# Patient Record
Sex: Male | Born: 1975 | Race: Black or African American | Hispanic: No | Marital: Single | Smoking: Current some day smoker
Health system: Southern US, Community
[De-identification: ages and names within clinical notes are randomized; demographics above are authoritative.]

## PROBLEM LIST (undated history)

## (undated) DIAGNOSIS — I82409 Acute embolism and thrombosis of unspecified deep veins of unspecified lower extremity: Secondary | ICD-10-CM

---

## 2008-12-13 ENCOUNTER — Emergency Department (HOSPITAL_BASED_OUTPATIENT_CLINIC_OR_DEPARTMENT_OTHER): Admission: EM | Admit: 2008-12-13 | Discharge: 2008-12-13 | Payer: Self-pay | Admitting: Emergency Medicine

## 2012-07-16 ENCOUNTER — Emergency Department (HOSPITAL_COMMUNITY): Payer: Self-pay

## 2012-07-16 ENCOUNTER — Encounter (HOSPITAL_COMMUNITY): Payer: Self-pay | Admitting: Emergency Medicine

## 2012-07-16 ENCOUNTER — Emergency Department (HOSPITAL_COMMUNITY)
Admission: EM | Admit: 2012-07-16 | Discharge: 2012-07-16 | Disposition: A | Discharge status: Home / Self Care | Payer: Self-pay | Attending: Emergency Medicine | Admitting: Emergency Medicine

## 2012-07-16 DIAGNOSIS — Y9289 Other specified places as the place of occurrence of the external cause: Secondary | ICD-10-CM | POA: Insufficient documentation

## 2012-07-16 DIAGNOSIS — F17200 Nicotine dependence, unspecified, uncomplicated: Secondary | ICD-10-CM | POA: Insufficient documentation

## 2012-07-16 DIAGNOSIS — W208XXA Other cause of strike by thrown, projected or falling object, initial encounter: Secondary | ICD-10-CM | POA: Insufficient documentation

## 2012-07-16 DIAGNOSIS — Y9389 Activity, other specified: Secondary | ICD-10-CM | POA: Insufficient documentation

## 2012-07-16 DIAGNOSIS — S62101A Fracture of unspecified carpal bone, right wrist, initial encounter for closed fracture: Secondary | ICD-10-CM

## 2012-07-16 DIAGNOSIS — S62109A Fracture of unspecified carpal bone, unspecified wrist, initial encounter for closed fracture: Secondary | ICD-10-CM | POA: Insufficient documentation

## 2012-07-16 MED ORDER — OXYCODONE-ACETAMINOPHEN 5-325 MG PO TABS: ORAL_TABLET | ORAL | Status: DC

## 2012-07-16 MED ORDER — OXYCODONE-ACETAMINOPHEN 5-325 MG PO TABS
2.0000 | ORAL_TABLET | Freq: Once | ORAL | Status: AC
  Administered 2012-07-16: 2 via ORAL
  Filled 2012-07-16: qty 2
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
## 2012-07-16 NOTE — ED Provider Notes (Signed)
Medical screening examination/treatment/procedure(s) were performed by non-physician practitioner and as supervising physician I was immediately available for consultation/collaboration.  Flint Melter, MD 07/16/12 2159

## 2013-06-29 ENCOUNTER — Encounter (HOSPITAL_BASED_OUTPATIENT_CLINIC_OR_DEPARTMENT_OTHER): Payer: Self-pay | Admitting: Emergency Medicine

## 2013-06-29 ENCOUNTER — Emergency Department (HOSPITAL_BASED_OUTPATIENT_CLINIC_OR_DEPARTMENT_OTHER)
Admission: EM | Admit: 2013-06-29 | Discharge: 2013-06-29 | Disposition: A | Discharge status: Home / Self Care | Payer: Self-pay | Attending: Emergency Medicine | Admitting: Emergency Medicine

## 2013-06-29 ENCOUNTER — Emergency Department (HOSPITAL_BASED_OUTPATIENT_CLINIC_OR_DEPARTMENT_OTHER): Payer: Self-pay

## 2013-06-29 DIAGNOSIS — Y92838 Other recreation area as the place of occurrence of the external cause: Secondary | ICD-10-CM

## 2013-06-29 DIAGNOSIS — S62319A Displaced fracture of base of unspecified metacarpal bone, initial encounter for closed fracture: Secondary | ICD-10-CM | POA: Insufficient documentation

## 2013-06-29 DIAGNOSIS — W219XXA Striking against or struck by unspecified sports equipment, initial encounter: Secondary | ICD-10-CM | POA: Insufficient documentation

## 2013-06-29 DIAGNOSIS — S62308A Unspecified fracture of other metacarpal bone, initial encounter for closed fracture: Secondary | ICD-10-CM

## 2013-06-29 DIAGNOSIS — Y9239 Other specified sports and athletic area as the place of occurrence of the external cause: Secondary | ICD-10-CM | POA: Insufficient documentation

## 2013-06-29 DIAGNOSIS — F17200 Nicotine dependence, unspecified, uncomplicated: Secondary | ICD-10-CM | POA: Insufficient documentation

## 2013-06-29 DIAGNOSIS — Y9371 Activity, boxing: Secondary | ICD-10-CM | POA: Insufficient documentation

## 2013-06-29 MED ORDER — NAPROXEN 250 MG PO TABS
500.0000 mg | ORAL_TABLET | Freq: Once | ORAL | Status: AC
  Administered 2013-06-29: 500 mg via ORAL
  Filled 2013-06-29: qty 2

## 2013-06-29 MED ORDER — OXYCODONE-ACETAMINOPHEN 5-325 MG PO TABS: 1.0000 | ORAL_TABLET | Freq: Four times a day (QID) | ORAL | Status: DC | PRN
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
## 2013-06-29 NOTE — ED Notes (Signed)
Pt voiced understanding to follow up with orthopedic surgeon

## 2013-06-29 NOTE — Discharge Instructions (Signed)
Cast or Splint Care  Casts and splints support injured limbs and keep bones from moving while they heal. It is important to care for your cast or splint at home.   HOME CARE INSTRUCTIONS   Keep the cast or splint uncovered during the drying period. It can take 24 to 48 hours to dry if it is made of plaster. A fiberglass cast will dry in less than 1 hour.   Do not rest the cast on anything harder than a pillow for the first 24 hours.   Do not put weight on your injured limb or apply pressure to the cast until your health care provider gives you permission.   Keep the cast or splint dry. Wet casts or splints can lose their shape and may not support the limb as well. A wet cast that has lost its shape can also create harmful pressure on your skin when it dries. Also, wet skin can become infected.   Cover the cast or splint with a plastic bag when bathing or when out in the rain or snow. If the cast is on the trunk of the body, take sponge baths until the cast is removed.   If your cast does become wet, dry it with a towel or a blow dryer on the cool setting only.   Keep your cast or splint clean. Soiled casts may be wiped with a moistened cloth.   Do not place any hard or soft foreign objects under your cast or splint, such as cotton, toilet paper, lotion, or powder.   Do not try to scratch the skin under the cast with any object. The object could get stuck inside the cast. Also, scratching could lead to an infection. If itching is a problem, use a blow dryer on a cool setting to relieve discomfort.   Do not trim or cut your cast or remove padding from inside of it.   Exercise all joints next to the injury that are not immobilized by the cast or splint. For example, if you have a long leg cast, exercise the hip joint and toes. If you have an arm cast or splint, exercise the shoulder, elbow, thumb, and fingers.   Elevate your injured arm or leg on 1 or 2 pillows for the first 1 to 3 days to decrease  swelling and pain.It is best if you can comfortably elevate your cast so it is higher than your heart.  SEEK MEDICAL CARE IF:    Your cast or splint cracks.   Your cast or splint is too tight or too loose.   You have unbearable itching inside the cast.   Your cast becomes wet or develops a soft spot or area.   You have a bad smell coming from inside your cast.   You get an object stuck under your cast.   Your skin around the cast becomes red or raw.   You have new pain or worsening pain after the cast has been applied.  SEEK IMMEDIATE MEDICAL CARE IF:    You have fluid leaking through the cast.   You are unable to move your fingers or toes.   You have discolored (blue or white), cool, painful, or very swollen fingers or toes beyond the cast.   You have tingling or numbness around the injured area.   You have severe pain or pressure under the cast.   You have any difficulty with your breathing or have shortness of breath.   You have chest   pain.  Document Released: 03/22/2000 Document Revised: 01/13/2013 Document Reviewed: 10/01/2012  [employer] Patient Information 2014 [employer].
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
## 2014-08-25 ENCOUNTER — Emergency Department (HOSPITAL_COMMUNITY)
Admission: EM | Admit: 2014-08-25 | Discharge: 2014-08-25 | Disposition: A | Discharge status: Home / Self Care | Payer: Self-pay | Attending: Emergency Medicine | Admitting: Emergency Medicine

## 2014-08-25 ENCOUNTER — Emergency Department (HOSPITAL_COMMUNITY): Payer: Self-pay

## 2014-08-25 ENCOUNTER — Encounter (HOSPITAL_COMMUNITY): Payer: Self-pay

## 2014-08-25 DIAGNOSIS — R6883 Chills (without fever): Secondary | ICD-10-CM | POA: Insufficient documentation

## 2014-08-25 DIAGNOSIS — R519 Headache, unspecified: Secondary | ICD-10-CM

## 2014-08-25 DIAGNOSIS — M791 Myalgia, unspecified site: Secondary | ICD-10-CM

## 2014-08-25 DIAGNOSIS — R5383 Other fatigue: Secondary | ICD-10-CM | POA: Insufficient documentation

## 2014-08-25 DIAGNOSIS — Z720 Tobacco use: Secondary | ICD-10-CM | POA: Insufficient documentation

## 2014-08-25 DIAGNOSIS — R51 Headache: Secondary | ICD-10-CM | POA: Insufficient documentation

## 2014-08-25 LAB — COMPREHENSIVE METABOLIC PANEL
ALK PHOS: 45 U/L (ref 38–126)
ALT: 14 U/L — AB (ref 17–63)
ANION GAP: 11 (ref 5–15)
AST: 19 U/L (ref 15–41)
Albumin: 3.6 g/dL (ref 3.5–5.0)
BILIRUBIN TOTAL: 0.7 mg/dL (ref 0.3–1.2)
BUN: 10 mg/dL (ref 6–20)
CO2: 22 mmol/L (ref 22–32)
Calcium: 8.1 mg/dL — ABNORMAL LOW (ref 8.9–10.3)
Chloride: 102 mmol/L (ref 101–111)
Creatinine, Ser: 1.18 mg/dL (ref 0.61–1.24)
GLUCOSE: 94 mg/dL (ref 65–99)
POTASSIUM: 3.6 mmol/L (ref 3.5–5.1)
SODIUM: 135 mmol/L (ref 135–145)
TOTAL PROTEIN: 7 g/dL (ref 6.5–8.1)

## 2014-08-25 LAB — CBC WITH DIFFERENTIAL/PLATELET
BASOS ABS: 0 10*3/uL (ref 0.0–0.1)
Basophils Relative: 1 % (ref 0–1)
Eosinophils Absolute: 0 10*3/uL (ref 0.0–0.7)
Eosinophils Relative: 1 % (ref 0–5)
HEMATOCRIT: 44.5 % (ref 39.0–52.0)
Hemoglobin: 14.6 g/dL (ref 13.0–17.0)
Lymphocytes Relative: 48 % — ABNORMAL HIGH (ref 12–46)
Lymphs Abs: 1 10*3/uL (ref 0.7–4.0)
MCH: 27.6 pg (ref 26.0–34.0)
MCHC: 32.8 g/dL (ref 30.0–36.0)
MCV: 84.1 fL (ref 78.0–100.0)
MONOS PCT: 14 % — AB (ref 3–12)
Monocytes Absolute: 0.3 10*3/uL (ref 0.1–1.0)
Neutro Abs: 0.7 10*3/uL — ABNORMAL LOW (ref 1.7–7.7)
Neutrophils Relative %: 36 % — ABNORMAL LOW (ref 43–77)
PLATELETS: 98 10*3/uL — AB (ref 150–400)
RBC: 5.29 MIL/uL (ref 4.22–5.81)
RDW: 14.3 % (ref 11.5–15.5)
WBC: 2 10*3/uL — AB (ref 4.0–10.5)

## 2014-08-25 LAB — PATHOLOGIST SMEAR REVIEW

## 2014-08-25 LAB — RAPID STREP SCREEN (MED CTR MEBANE ONLY): STREPTOCOCCUS, GROUP A SCREEN (DIRECT): NEGATIVE

## 2014-08-25 MED ORDER — SODIUM CHLORIDE 0.9 % IV BOLUS (SEPSIS)
1000.0000 mL | INTRAVENOUS | Status: AC
  Administered 2014-08-25: 1000 mL via INTRAVENOUS

## 2014-08-25 MED ORDER — ACETAMINOPHEN 500 MG PO TABS
1000.0000 mg | ORAL_TABLET | Freq: Once | ORAL | Status: AC
  Administered 2014-08-25: 1000 mg via ORAL
  Filled 2014-08-25: qty 2

## 2014-08-25 MED ORDER — IBUPROFEN 800 MG PO TABS: 800.0000 mg | ORAL_TABLET | Freq: Three times a day (TID) | ORAL | Status: DC | PRN

## 2014-08-25 MED ORDER — DIPHENHYDRAMINE HCL 50 MG/ML IJ SOLN
25.0000 mg | Freq: Once | INTRAMUSCULAR | Status: AC
  Administered 2014-08-25: 25 mg via INTRAVENOUS
  Filled 2014-08-25: qty 1

## 2014-08-25 MED ORDER — KETOROLAC TROMETHAMINE 30 MG/ML IJ SOLN
30.0000 mg | Freq: Once | INTRAMUSCULAR | Status: AC
  Administered 2014-08-25: 30 mg via INTRAVENOUS
  Filled 2014-08-25: qty 1

## 2014-08-25 MED ORDER — PROCHLORPERAZINE EDISYLATE 5 MG/ML IJ SOLN
10.0000 mg | Freq: Once | INTRAMUSCULAR | Status: AC
  Administered 2014-08-25: 10 mg via INTRAVENOUS
  Filled 2014-08-25: qty 2

## 2014-08-25 NOTE — ED Notes (Signed)
Patient transported to X-ray 

## 2014-08-25 NOTE — ED Notes (Signed)
Pt complains of being weak and cold for three days, no vomiting or diarrhea, no cough

## 2014-08-25 NOTE — ED Provider Notes (Signed)
CSN: [id number]     Arrival date & time 08/25/14  0433 History   First MD Initiated Contact with Patient 08/25/14 0444     Chief Complaint  Patient presents with  . URI   (Consider location/radiation/quality/duration/timing/severity/associated sxs/prior Treatment) HPI  Luis Carson is a 39 -year-old male presenting with headache. He states his headache began gradually 2 days ago and has been intermittent since then. He reports the headache is across his forehead. He also reports feeling weak and tired with some general muscle aches. He rates his pain as a 7/10. He denies photophobia, nausea, vomiting, abdominal pain, neck pain or stiffness, nasal congestion or cough.  History reviewed. No pertinent past medical history. History reviewed. No pertinent past surgical history. History reviewed. No pertinent family history. History  Substance Use Topics  . Smoking status: Current Some Day Smoker  . Smokeless tobacco: Not on file  . Alcohol Use: No    Review of Systems  Constitutional: Positive for chills and fatigue. Negative for fever.  HENT: Negative for sore throat.   Eyes: Negative for visual disturbance.  Respiratory: Negative for cough and shortness of breath.   Cardiovascular: Negative for chest pain and leg swelling.  Gastrointestinal: Negative for nausea, vomiting and diarrhea.  Genitourinary: Negative for dysuria.  Musculoskeletal: Positive for myalgias.  Skin: Negative for rash.  Neurological: Positive for headaches. Negative for weakness and numbness.    Allergies  Review of patient's allergies indicates no known allergies.  Home Medications   Prior to Admission medications   Medication Sig Start Date End Date Taking? Authorizing Provider  DM-Doxylamine-Acetaminophen (NYQUIL COLD & FLU PO) Take 1 capsule by mouth once.   Yes Historical Provider, MD  Phenylephrine-DM-GG-APAP 5-10-200-325 MG TABS Take 1 capsule by mouth once.   Yes Historical Provider, MD   oxyCODONE-acetaminophen (PERCOCET) 5-325 MG per tablet Take 1-2 tablets every 4-6 hours as needed for pain Patient not taking: Reported on 08/25/2014 07/16/12   Lowell BoutonBarbara A Beck, PA-C  oxyCODONE-acetaminophen (PERCOCET) 5-325 MG per tablet Take 1 tablet by mouth every 6 (six) hours as needed. Patient not taking: Reported on 08/25/2014 06/29/13   April Palumbo, MD   BP 116/71 mmHg  Pulse 91  Temp(Src) 99.3 F (37.4 C) (Oral)  Resp 18  Ht 6\' 2"  (1.88 m)  Wt 250 lb (113.399 kg)  BMI 32.08 kg/m2  SpO2 97% Physical Exam  Constitutional: He is oriented to person, place, and time. He appears well-developed and well-nourished. No distress.  HENT:  Head: Normocephalic and atraumatic.  Mouth/Throat: Oropharynx is clear and moist. No oropharyngeal exudate.  Eyes: Conjunctivae are normal. Pupils are equal, round, and reactive to light.  Neck: Normal range of motion. Neck supple.  Cardiovascular: Normal rate, regular rhythm and intact distal pulses.   Pulmonary/Chest: Effort normal and breath sounds normal. No respiratory distress. He has no wheezes. He has no rales. He exhibits no tenderness.  Abdominal: Soft. He exhibits no distension and no mass. There is no tenderness. There is no rebound and no guarding.  Musculoskeletal: Normal range of motion. He exhibits no tenderness.  Lymphadenopathy:    He has no cervical adenopathy.  Neurological: He is alert and oriented to person, place, and time. He has normal strength. No cranial nerve deficit or sensory deficit. GCS eye subscore is 4. GCS verbal subscore is 5. GCS motor subscore is 6.  Neurological: She is alert and oriented to person, place, and time. She has normal strength. No cranial nerve deficit or sensory deficit. Coordination  normal.  Alert, oriented, thought content appropriate. Speech fluent without evidence of aphasia. Able to follow 2 step commands without difficulty.  Cranial Nerves:  II:  Peripheral visual fields grossly normal, pupils  equal, round, reactive to light III,IV, VI: ptosis not present, extra-ocular motions intact bilaterally  V,VII: smile symmetric, facial light touch sensation equal VIII: hearing grossly normal bilaterally  IX,X: gag reflex present  XI: bilateral shoulder shrug equal and strong XII: midline tongue extension  Motor:  5/5 in upper and lower extremities bilaterally including strong and equal grip strength and dorsiflexion/plantar flexion Sensory: Pinprick and light touch normal in all extremities.  Deep Tendon Reflexes: 2+ and symmetric  Cerebellar: normal finger-to-nose with bilateral upper extremities Gait: normal gait and balance CV: distal pulses palpable throughout     Skin: Skin is warm and dry. No rash noted. He is not diaphoretic.  Psychiatric: He has a normal mood and affect.  Nursing note and vitals reviewed.   ED Course  Procedures (including critical care time) Labs Review Labs Reviewed - No data to display  Imaging Review No results found.   EKG Interpretation None      MDM   Final diagnoses:  None   39 yo presenting with muscle aches, generalized fatigue and gradual onset headache. No clear viral or bacterial source as he denies nasal congestion, cough, sore throat, neck stiffness, dysuria, or wounds. He is overall well appearing and in no acute distress.  Will treat with NS bolus and headache meds and swab for strep.   6:05 AM: At end of shift, hand-off report given to [facility], PA-C.  Plan includes re-eval after headache cocktail and follow strep swab and disposition appropriately.   Filed Vitals:   08/25/14 0440  BP: 116/71  Pulse: 91  Temp: 99.3 F (37.4 C)  TempSrc: Oral  Resp: 18  Height: 6\' 2"  (1.88 m)  Weight: 250 lb (113.399 kg)  SpO2: 97%   Meds given in ED:  Medications  sodium chloride 0.9 % bolus 1,000 mL (0 mLs Intravenous Stopped 08/25/14 0710)  ketorolac (TORADOL) 30 MG/ML injection 30 mg (30 mg Intravenous Given 08/25/14 0556)   prochlorperazine (COMPAZINE) injection 10 mg (10 mg Intravenous Given 08/25/14 0555)  diphenhydrAMINE (BENADRYL) injection 25 mg (25 mg Intravenous Given 08/25/14 0556)  sodium chloride 0.9 % bolus 1,000 mL (0 mLs Intravenous Stopped 08/25/14 0807)  acetaminophen (TYLENOL) tablet 1,000 mg (1,000 mg Oral Given 08/25/14 0709)    New Prescriptions   No medications on file       Harle BattiestElizabeth Emily Massar, NP 08/25/14 1636  Loren Raceravid Yelverton, MD 08/26/14 [phone]

## 2014-08-25 NOTE — ED Provider Notes (Signed)
6:24 AM Patient signed out to me at change of shift by Donetta PottsBeth Tysinger, NP.  Patient with intermittent, gradually worsening headache x 2 days.  Suspected viral syndrome.  Strep screen, HA cocktail pending.    6:33 AM Pt reports he is not feeling any different after headache cocktail.  Has had myalgias, headache, feeling hot/cold.  No other specific symptoms.  He does have a new tattoo but it is from a tattoo artist/shop he trusts, done 1 month ago, no e/o infection.  Denies URI symptoms, GI symptoms, skin infections, neck stiffness, recent travel, possibility of tick exposure (including sitting outside, long grass), dental pain, joint pain, rashes, penile discharge.  Has had negative STD/HIV testing in the past. No known sick contacts.  Patient is A&Ox4, NAD, skin warm, oropharynx clear no abnormal dentition noted, RRR, lungs CTAB, abd soft, NT.  Added CBC,CMP, blood cultures, CXR.  Declines HIV testing today.    8:53 AM Pt reports he is feeling better.  Workup remarkable for leukopenia with lymphocyte predominance.  Likely early viral illness.  Encouraged close PCP follow up and return for any worsening symptoms.  Suspect this is viral and source may declare itself soon.   I discussed all results with the patient including the unknown source and discussed strict return precautions.  Will have case manager see patient if available prior to discharge for help with primary care follow up.  Resources provided.  D/C with ibuprofen.   Discussed result, findings, treatment, and follow up  with patient.  Pt given return precautions.  Pt verbalizes understanding and agrees with plan.      Results for orders placed or performed during the hospital encounter of 08/25/14  Rapid strep screen  Result Value Ref Range   Streptococcus, Group A Screen (Direct) NEGATIVE NEGATIVE  Comprehensive metabolic panel  Result Value Ref Range   Sodium 135 135 - 145 mmol/L   Potassium 3.6 3.5 - 5.1 mmol/L   Chloride 102 101 - 111  mmol/L   CO2 22 22 - 32 mmol/L   Glucose, Bld 94 65 - 99 mg/dL   BUN 10 6 - 20 mg/dL   Creatinine, Ser 3.081.18 0.61 - 1.24 mg/dL   Calcium 8.1 (L) 8.9 - 10.3 mg/dL   Total Protein 7.0 6.5 - 8.1 g/dL   Albumin 3.6 3.5 - 5.0 g/dL   AST 19 15 - 41 U/L   ALT 14 (L) 17 - 63 U/L   Alkaline Phosphatase 45 38 - 126 U/L   Total Bilirubin 0.7 0.3 - 1.2 mg/dL   GFR calc non Af Amer >60 >60 mL/min   GFR calc Af Amer >60 >60 mL/min   Anion gap 11 5 - 15  CBC with Differential  Result Value Ref Range   WBC 2.0 (L) 4.0 - 10.5 K/uL   RBC 5.29 4.22 - 5.81 MIL/uL   Hemoglobin 14.6 13.0 - 17.0 g/dL   HCT 65.744.5 84.639.0 - 96.252.0 %   MCV 84.1 78.0 - 100.0 fL   MCH 27.6 26.0 - 34.0 pg   MCHC 32.8 30.0 - 36.0 g/dL   RDW 95.214.3 84.111.5 - 32.415.5 %   Platelets 98 (L) 150 - 400 K/uL   Neutrophils Relative % 36 (L) 43 - 77 %   Lymphocytes Relative 48 (H) 12 - 46 %   Monocytes Relative 14 (H) 3 - 12 %   Eosinophils Relative 1 0 - 5 %   Basophils Relative 1 0 - 1 %   Neutro Abs  0.7 (L) 1.7 - 7.7 K/uL   Lymphs Abs 1.0 0.7 - 4.0 K/uL   Monocytes Absolute 0.3 0.1 - 1.0 K/uL   Eosinophils Absolute 0.0 0.0 - 0.7 K/uL   Basophils Absolute 0.0 0.0 - 0.1 K/uL   Smear Review MORPHOLOGY UNREMARKABLE    Dg Chest 2 View  08/25/2014   CLINICAL DATA:  39 year old male with a history of generalized body aches.  EXAM: CHEST - 2 VIEW  COMPARISON:  None.  FINDINGS: Cardiomediastinal silhouette projects within normal limits in size and contour. No confluent airspace disease, pneumothorax, or pleural effusion.  No displaced fracture.  Unremarkable appearance of the upper abdomen.  IMPRESSION: No radiographic evidence of acute cardiopulmonary disease.  Signed,  Yvone NeuJaime S. Loreta AveWagner, DO  [facility]  [facility]   Electronically Signed   By: Gilmer MorJaime  Wagner D.O.   On: 08/25/2014 07:45      Trixie Dredgemily Crispin Vogel, PA-C 08/25/14 0855  Trixie DredgeEmily Taleia Sadowski, PA-C 08/25/14 [id number]  Loren Raceravid Yelverton, MD 08/26/14 [phone]

## 2014-08-25 NOTE — Progress Notes (Addendum)
CM noted CM consult,  CM called by ED RN,  consult reviewed referral to [facility] completed,  Irving BurtonEmily PA notified Pt wiill be contacted by [facility] staff member and can be discharged

## 2014-08-25 NOTE — Progress Notes (Signed)
ED CM left pt a voice message requesting a return call to CM office number

## 2014-08-25 NOTE — ED Notes (Signed)
Patient complains of lethargy and headache x 2 days.  Denies N/V/D.  Denies cough/sore throat/sneezing.  Has been taking Tylenol Cold & Flu with minimal relief.

## 2014-08-25 NOTE — Discharge Instructions (Signed)
Read the information below.  Use the prescribed medication as directed.  Please discuss all new medications with your pharmacist.  You may return to the Emergency Department at any time for worsening condition or any new symptoms that concern you.    If you develop high fevers, uncontrolled pain, uncontrolled vomiting, or difficulty breathing or swallowing return to the ER for a recheck.       You are having a headache. No specific cause was found today for your headache. It may have been a migraine or other cause of headache. Stress, anxiety, fatigue, and depression are common triggers for headaches. Your headache today does not appear to be life-threatening or require hospitalization, but often the exact cause of headaches is not determined in the emergency department. Therefore, follow-up with your doctor is very important to find out what may have caused your headache, and whether or not you need any further diagnostic testing or treatment. Sometimes headaches can appear benign (not harmful), but then more serious symptoms can develop which should prompt an immediate re-evaluation by your doctor or the emergency department. SEEK MEDICAL ATTENTION IF: You develop possible problems with medications prescribed.  The medications don't resolve your headache, if it recurs , or if you have multiple episodes of vomiting or can't take fluids. You have a change from the usual headache. RETURN IMMEDIATELY IF you develop a sudden, severe headache or confusion, become poorly responsive or faint, develop a fever above 100.32F or problem breathing, have a change in speech, vision, swallowing, or understanding, or develop new weakness, numbness, tingling, incoordination, or have a seizure.    General Headache Without Cause A headache is pain or discomfort felt around the head or neck area. The specific cause of a headache may not be found. There are many causes and types of headaches. A few common ones  are:  Tension headaches.  Migraine headaches.  Cluster headaches.  Chronic daily headaches. HOME CARE INSTRUCTIONS   Keep all follow-up appointments with your caregiver or any specialist referral.  Only take over-the-counter or prescription medicines for pain or discomfort as directed by your caregiver.  Lie down in a dark, quiet room when you have a headache.  Keep a headache journal to find out what may trigger your migraine headaches. For example, write down:  What you eat and drink.  How much sleep you get.  Any change to your diet or medicines.  Try massage or other relaxation techniques.  Put ice packs or heat on the head and neck. Use these 3 to 4 times per day for 15 to 20 minutes each time, or as needed.  Limit stress.  Sit up straight, and do not tense your muscles.  Quit smoking if you smoke.  Limit alcohol use.  Decrease the amount of caffeine you drink, or stop drinking caffeine.  Eat and sleep on a regular schedule.  Get 7 to 9 hours of sleep, or as recommended by your caregiver.  Keep lights dim if bright lights bother you and make your headaches worse. SEEK MEDICAL CARE IF:   You have problems with the medicines you were prescribed.  Your medicines are not working.  You have a change from the usual headache.  You have nausea or vomiting. SEEK IMMEDIATE MEDICAL CARE IF:   Your headache becomes severe.  You have a fever.  You have a stiff neck.  You have loss of vision.  You have muscular weakness or loss of muscle control.  You start losing your  balance or have trouble walking.  You feel faint or pass out.  You have severe symptoms that are different from your first symptoms. MAKE SURE YOU:   Understand these instructions.  Will watch your condition.  Will get help right away if you are not doing well or get worse. Document Released: 03/25/2005 Document Revised: 06/17/2011 Document Reviewed: 04/10/2011 [facility] Patient  Information 2015 [place], [place]. This information is not intended to replace advice given to you by your health care provider. Make sure you discuss any questions you have with your health care provider.  Muscle Pain Muscle pain (myalgia) may be caused by many things, including:  Overuse or muscle strain, especially if you are not in shape. This is the most common cause of muscle pain.  Injury.  Bruises.  Viruses, such as the flu.  Infectious diseases.  Fibromyalgia, which is a chronic condition that causes muscle tenderness, fatigue, and headache.  Autoimmune diseases, including lupus.  Certain drugs, including ACE inhibitors and statins. Muscle pain may be mild or severe. In most cases, the pain lasts only a short time and goes away without treatment. To diagnose the cause of your muscle pain, your health care provider will take your medical history. This means he or she will ask you when your muscle pain began and what has been happening. If you have not had muscle pain for very long, your health care provider may want to wait before doing much testing. If your muscle pain has lasted a long time, your health care provider may want to run tests right away. If your health care provider thinks your muscle pain may be caused by illness, you may need to have additional tests to rule out certain conditions.  Treatment for muscle pain depends on the cause. Home care is often enough to relieve muscle pain. Your health care provider may also prescribe anti-inflammatory medicine. HOME CARE INSTRUCTIONS Watch your condition for any changes. The following actions may help to lessen any discomfort you are feeling:  Only take over-the-counter or prescription medicines as directed by your health care provider.  Apply ice to the sore muscle:  Put ice in a plastic bag.  Place a towel between your skin and the bag.  Leave the ice on for 15-20 minutes, 3-4 times a day.  You may alternate applying  hot and cold packs to the muscle as directed by your health care provider.  If overuse is causing your muscle pain, slow down your activities until the pain goes away.  Remember that it is normal to feel some muscle pain after starting a workout program. Muscles that have not been used often will be sore at first.  Do regular, gentle exercises if you are not usually active.  Warm up before exercising to lower your risk of muscle pain.  Do not continue working out if the pain is very bad. Bad pain could mean you have injured a muscle. SEEK MEDICAL CARE IF:  Your muscle pain gets worse, and medicines do not help.  You have muscle pain that lasts longer than 3 days.  You have a rash or fever along with muscle pain.  You have muscle pain after a tick bite.  You have muscle pain while working out, even though you are in good physical condition.  You have redness, soreness, or swelling along with muscle pain.  You have muscle pain after starting a new medicine or changing the dose of a medicine. SEEK IMMEDIATE MEDICAL CARE IF:  You  have trouble breathing.  You have trouble swallowing.  You have muscle pain along with a stiff neck, fever, and vomiting.  You have severe muscle weakness or cannot move part of your body. MAKE SURE YOU:   Understand these instructions.  Will watch your condition.  Will get help right away if you are not doing well or get worse. Document Released: 02/14/2006 Document Revised: 03/30/2013 Document Reviewed: 01/19/2013 [facility] Patient Information 2015 [place], [place]. This information is not intended to replace advice given to you by your health care provider. Make sure you discuss any questions you have with your health care provider.    Emergency Department Resource Guide 1) Find a Doctor and Pay Out of Pocket Although you won't have to find out who is covered by your insurance plan, it is a good idea to ask around and get recommendations. You  will then need to call the office and see if the doctor you have chosen will accept you as a new patient and what types of options they offer for patients who are self-pay. Some doctors offer discounts or will set up payment plans for their patients who do not have insurance, but you will need to ask so you aren't surprised when you get to your appointment.  2) Contact Your Local Health Department Not all health departments have doctors that can see patients for sick visits, but many do, so it is worth a call to see if yours does. If you don't know where your local health department is, you can check in your phone book. The CDC also has a tool to help you locate your state's health department, and many state websites also have listings of all of their local health departments.  3) Find a Walk-in Clinic If your illness is not likely to be very severe or complicated, you may want to try a walk in clinic. These are popping up all over the country in pharmacies, drugstores, and shopping centers. They're usually staffed by nurse practitioners or physician assistants that have been trained to treat common illnesses and complaints. They're usually fairly quick and inexpensive. However, if you have serious medical issues or chronic medical problems, these are probably not your best option.  No Primary Care Doctor: - Call Health Connect at  [phone] - they can help you locate a primary care doctor that  accepts your insurance, provides certain services, etc. - Physician Referral Service- [phone]  Chronic Pain Problems: Organization         Address  Phone   Notes  [facility]  [phone] Patients need to be referred by their primary care doctor.   Medication Assistance: Organization         Address  Phone   Notes  [facility] Med[facility] [address] [place], [place] [address] [phone] --Must be a resident of [facility] -- Must  have NO insurance coverage whatsoever (no Medicaid/ Medicare, etc.) -- The pt. MUST have a primary care doctor that directs their care regularly and follows them in the community   Med[facility]ist  [phone]   [employer]  [phone]    Agencies that provide inexpensive medical care: Organization         Address  Phone   Notes  [facility]  [phone]   [facility]    [phone]   [facility] [address] [place], [place] [address] [phone]  [facility] [address], [place] [phone]) [phone]   Planned Parenthood    [phone]   [facility]    [phone]   [facility] and [facility]  [address], [place] Phone:  [phone](336) [phone], Fax:  [phone] Hours of Operation:  9 am - 6 pm, M-F.  Also accepts Medicaid/Medicare and self-pay.  [facility]  [address], [place] Phone: [phone]) [phone], Fax: [phone]. Hours of Operation:  8:30 am - 5:30 pm, M-F.  Also accepts Medicaid and self-pay.  [facility] [place] [address], [place] Phone: [phone]) [phone]   [facility] [address], [place], [place] [phone], Ext. [phone] Mondays & Thursdays: 7-9 AM.  First 15 patients are seen on a first come, first serve basis.    Medicaid-accepting [facility] Providers:  Organization         Address  Phone   Notes  [facility] [address], [place] [phone] Also accepts self-pay patients.  [facility] [address], [place]  [phone]) [phone]   [facility] [address], [place] [phone]   [facility] [address] [place] [address], [place] [phone]) [phone]   Renaye RakersVeita Bland [address], [place]   [phone]) [phone] Only accepts [place] Access [place] patients after  they have their name applied to their card.   Self-Pay (no insurance) in [facility]:  Organization         Address  Phone   Notes  Sickle Cell Patients, [facility] [address], [place] [phone]) [phone]   [facility] [address], [place] [phone]) [phone]   [facility]  [address], [place] [phone]) [phone]   [facility]/Dr. Osei-Bonsu  [address] [place] [address], [place] or [phone], [place] [phone]) [phone] Phone number for both [place] and [place] locations is the same.  [facility] [address], [place] [phone]   [facility] [place] [address] [place] [address], [place] or [address] [phone]) [phone]   [facility] [address], [place] [phone](336) [phone], phone; [phone], fax Sees patients 1st and 3rd Saturday of every month.  Must not qualify for public or private insurance (i.e. Medicaid, Medicare, Center Health Choice, Veterans' Benefits)  Household income should be no more than 200% of the poverty level The clinic cannot treat you if you are pregnant or think you are pregnant  Sexually transmitted diseases are not treated at the clinic.    Dental Care: Organization         Address  Phone  Notes  [facility] [address], [place] [phone] Accepts children up to age 37 who are enrolled in [place] or [place] Health Choice; pregnant women with a Medicaid card; and children who have applied for Medicaid or [place] Health Choice, but were declined, whose parents can pay a reduced fee at time of service.  [facility] [place]  [address], [place] [phone] Accepts children up to age 521 who are enrolled in [place] or  Health Choice; pregnant women with a Medicaid card; and children  who  have applied for Medicaid or [place] Health Choice, but were declined, whose parents can pay a reduced fee at time of service.  [facility]  [address], [place] [phone]) [phone] Patients are seen by appointment only. Walk-ins are not accepted. [facility] will see patients 37 years of age and older. Monday - Tuesday (8am-5pm) Most Wednesdays (8:30-5pm) $30 per visit, cash only  [facility]  [address], [facility] [phone] Patients are seen by appointment only. Walk-ins are not accepted. [facility] will see patients 37 years of age and older. One Wednesday Evening (Monthly: Volunteer Based).  $30 per visit, cash only  [employer]  [phone]) [phone] for adults; Children under age 674, call Graduate Pediatric Dentistry at [phone]) [phone]. Children aged 554-14, please call [phone]) [phone] to request a pediatric application.  Dental services are provided in all areas of dental care including fillings, crowns and bridges, complete and partial dentures, implants, gum treatment, root canals, and extractions. Preventive care is also provided. Treatment is provided to both adults and children. Patients are selected via a lottery and there is often a waiting list.   [facility] [address], [place]  [phone] [email or web address]   [facility] [address], [place], [place] [phone], Ext. [phone] Second and Fourth Thursday of each month, opens at 6:30 AM; Clinic ends at 9 AM.  Patients are seen on a first-come first-served basis, and a limited number are seen during each clinic.   [facility]  [address], [place], [place] [phone]) [phone]   Eligibility Requirements You must have lived in [place], [place], or [place] counties for at least the last three months.   You cannot be eligible for state or federal sponsored National Cityhealthcare insurance, including [employer]eterans  Administration, [place]icaid, or [employer]Medicare.   You generally cannot be eligible for healthcare insurance through your employer.    How to apply: Eligibility screenings are held every Tuesday and Wednesday afternoon from 1:00 pm until 4:00 pm. You do not need an appointment for the interview!  [facility] [address], [place], [place] [phone]   [facility]  [phone]   [facility]  [phone]   [facility]  [phone]    Behavioral Health Resources in the Community: Intensive Outpatient Programs Organization         Address  Phone  Notes  [facility] [address], [place], [place] [phone]   [facility] [address], [place], [place] [phone]   [facility] [address], [place], [place]  [phone]   [facility] [address],  [place], [place] [phone] or [phone]   Substance Abuse Resources Organization         Address  Phone  Notes  [facility] Drug Services  [phone]   [facility]  [phone]   [facility]  [phone]   [facility]  [phone]   Residential & Outpatient Substance Abuse Program  [phone]   Psychological Services Organization         Address  Phone  Notes  [facility]  [phone]- [phone]   [facility]  [phone]- [phone]   [facility] [address], [place] [phone] or [phone]    Mobile Crisis Teams Organization  Address  Phone  Notes  [facility], Mobile Crisis Care Unit  [phone]   [facility]  [address] [place], [place] [phone]   [facility] [address] [place] [phone]    Self-Help/Support Groups Organization         Address  Phone             Notes  Mental Health Assoc. of  -  variety of support groups  336- I7437963 Call for more information  Narcotics Anonymous (NA), [facility]aring [facility] [address], [employer] [place]  2 meetings at this location   Statistician         Address  Phone  Notes  [facility] [address],    [place] [place]  [phone]   [facility]  [address] [place] [address], [place], [place] [phone]   [facility] [address] [place], [place] [phone] Admissions: 8am-3pm M-F  [facility] [address],    [place], [place] [phone]   [facility] [address], [place], [place] [phone]   [facility] [address],  [place], [place] [phone]   [facility] - Intensive Outpatient [address], [place], [place] [phone]   [facility] ([facility] [address]  [place], [place] [phone] or [phone]   [facility] (RTS) [address] [place], [place] [phone] Accepts Medicaid  [facility] [address],  [place] [phone] Substance Abuse/Addiction Treatment   [facility] Organization         Address  Phone  Notes  [facility]  [phone]   Angie Fava, PhD [address] [place], [place]   [phone] or [phone]   [facility]   [address] [place], [place] [phone]   [facility] [address], [place], [place] [phone] Insurance/Medicaid/sponsorship through [facility] and Families [address]                                    Chamaine Stankus [place], [place] [phone] Therapy/tele-psych/case  [facility] [address], [place] [phone]    Dr. Lolly Mustache  [phone]   [facility]  [employer] [facility]. 1) [address], [place] 2) [address] [place] 3)  [address], [place] [phone]  [phone]   [facility] Child Abuse Hotline 254-320-2099 or [phone] (After Hours)

## 2014-08-27 LAB — CULTURE, GROUP A STREP

## 2014-08-31 LAB — CULTURE, BLOOD (ROUTINE X 2)
CULTURE: NO GROWTH
CULTURE: NO GROWTH

## 2015-02-11 ENCOUNTER — Emergency Department (HOSPITAL_COMMUNITY)
Admission: EM | Admit: 2015-02-11 | Discharge: 2015-02-11 | Disposition: A | Discharge status: Home / Self Care | Payer: Self-pay | Attending: Emergency Medicine | Admitting: Emergency Medicine

## 2015-02-11 ENCOUNTER — Emergency Department (HOSPITAL_COMMUNITY): Payer: Self-pay

## 2015-02-11 ENCOUNTER — Encounter (HOSPITAL_COMMUNITY): Payer: Self-pay | Admitting: Emergency Medicine

## 2015-02-11 DIAGNOSIS — M79644 Pain in right finger(s): Secondary | ICD-10-CM | POA: Insufficient documentation

## 2015-02-11 DIAGNOSIS — Z720 Tobacco use: Secondary | ICD-10-CM | POA: Insufficient documentation

## 2015-02-11 DIAGNOSIS — M7989 Other specified soft tissue disorders: Secondary | ICD-10-CM | POA: Insufficient documentation

## 2015-02-11 DIAGNOSIS — Z79899 Other long term (current) drug therapy: Secondary | ICD-10-CM | POA: Insufficient documentation

## 2015-02-11 MED ORDER — IBUPROFEN 800 MG PO TABS: 800.0000 mg | ORAL_TABLET | Freq: Three times a day (TID) | ORAL | Status: DC

## 2015-02-11 MED ORDER — IBUPROFEN 800 MG PO TABS
800.0000 mg | ORAL_TABLET | Freq: Once | ORAL | Status: AC
  Administered 2015-02-11: 800 mg via ORAL
  Filled 2015-02-11: qty 1

## 2015-02-11 NOTE — Discharge Instructions (Signed)
1. Medications: ibuprofen, usual home medications 2. Treatment: rest, drink plenty of fluids, ice, elevate, wear finger splint 3. Follow Up: please followup with your primary doctor for discussion of your diagnoses and further evaluation after today's visit; if you do not have a primary care doctor use the resource guide provided to find one; please return to the ER for increased pain or swelling, numbness, new or worsening symptoms   Musculoskeletal Pain Musculoskeletal pain is muscle and boney aches and pains. These pains can occur in any part of the body. Your caregiver may treat you without knowing the cause of the pain. They may treat you if blood or urine tests, X-rays, and other tests were normal.  CAUSES There is often not a definite cause or reason for these pains. These pains may be caused by a type of germ (virus). The discomfort may also come from overuse. Overuse includes working out too hard when your body is not fit. Boney aches also come from weather changes. Bone is sensitive to atmospheric pressure changes. HOME CARE INSTRUCTIONS   Ask when your test results will be ready. Make sure you get your test results.  Only take over-the-counter or prescription medicines for pain, discomfort, or fever as directed by your caregiver. If you were given medications for your condition, do not drive, operate machinery or power tools, or sign legal documents for 24 hours. Do not drink alcohol. Do not take sleeping pills or other medications that may interfere with treatment.  Continue all activities unless the activities cause more pain. When the pain lessens, slowly resume normal activities. Gradually increase the intensity and duration of the activities or exercise.  During periods of severe pain, bed rest may be helpful. Lay or sit in any position that is comfortable.  Putting ice on the injured area.  Put ice in a bag.  Place a towel between your skin and the bag.  Leave the ice on for  15 to 20 minutes, 3 to 4 times a day.  Follow up with your caregiver for continued problems and no reason can be found for the pain. If the pain becomes worse or does not go away, it may be necessary to repeat tests or do additional testing. Your caregiver may need to look further for a possible cause. SEEK IMMEDIATE MEDICAL CARE IF:  You have pain that is getting worse and is not relieved by medications.  You develop chest pain that is associated with shortness or breath, sweating, feeling sick to your stomach (nauseous), or throw up (vomit).  Your pain becomes localized to the abdomen.  You develop any new symptoms that seem different or that concern you. MAKE SURE YOU:   Understand these instructions.  Will watch your condition.  Will get help right away if you are not doing well or get worse.   This information is not intended to replace advice given to you by your health care provider. Make sure you discuss any questions you have with your health care provider.   Document Released: 03/25/2005 Document Revised: 06/17/2011 Document Reviewed: 11/27/2012 Elsevier Interactive Patient Education 2016 [employer].   Emergency Department Resource Guide 1) Find a Doctor and Pay Out of Pocket Although you won't have to find out who is covered by your insurance plan, it is a good idea to ask around and get recommendations. You will then need to call the office and see if the doctor you have chosen will accept you as a new patient and what types  of options they offer for patients who are self-pay. Some doctors offer discounts or will set up payment plans for their patients who do not have insurance, but you will need to ask so you aren't surprised when you get to your appointment.  2) Contact Your Local Health Department Not all health departments have doctors that can see patients for sick visits, but many do, so it is worth a call to see if yours does. If you don't know where your local  health department is, you can check in your phone book. The CDC also has a tool to help you locate your state's health department, and many state websites also have listings of all of their local health departments.  3) Find a Walk-in Clinic If your illness is not likely to be very severe or complicated, you may want to try a walk in clinic. These are popping up all over the country in pharmacies, drugstores, and shopping centers. They're usually staffed by nurse practitioners or physician assistants that have been trained to treat common illnesses and complaints. They're usually fairly quick and inexpensive. However, if you have serious medical issues or chronic medical problems, these are probably not your best option.  No Primary Care Doctor: - Call Health Connect at  [phone] - they can help you locate a primary care doctor that  accepts your insurance, provides certain services, etc. - Physician Referral Service- [phone]  Chronic Pain Problems: Organization         Address  Phone   Notes  [facility]  [phone] Patients need to be referred by their primary care doctor.   Medication Assistance: Organization         Address  Phone   Notes  [facility] Medication [facility] [address] [place], [place] [phone] --Must be a resident of [facility] -- Must have NO insurance coverage whatsoever (no Medicaid/ Medicare, etc.) -- The pt. MUST have a primary care doctor that directs their care regularly and follows them in the community   MedAssist  [phone]   [employer]  [phone]    Agencies that provide inexpensive medical care: Organization         Address  Phone   Notes  [facility]  [phone]) [phone]   [facility]    [phone]) [phone]   [facility] [address] [place], [place] [address] [phone]   [facility] [address], [place] [phone]) [phone]   [facility]anned Parenthood    [phone](336) [phone]   [facility]    [phone]   [facility] and [facility]  [address], [place] Phone:  [phone], Fax:  [phone] Hours of Operation:  9 am - 6 pm, M-F.  Also accepts Medicaid/Medicare and self-pay.  [facility]  [address], [place] Phone: [phone], Fax: [phone]. Hours of Operation:  8:30 am - 5:30 pm, M-F.  Also accepts Medicaid and self-pay.  [facility] [place] [address], [place] Phone: [phone]) [phone]   [facility] [address], [place], [place] [phone], Ext. [phone] Mondays & Thursdays: 7-9 AM.  First 15 patients are seen on a first come, first serve basis.    Medicaid-accepting [facility] Providers:  Organization         Address  Phone   Notes  [facility] [address], [place] [phone] Also accepts self-pay patients.  [facility] [address] Laurell Josephs [place], [place]  [phone]   [facility] [address], [place] [phone]   [facility] [address] [place] [phone]   Renaye Rakers [address], [place]   [phone] Only accepts [place] Access [place] patients after they have their name applied to their card.   Self-Pay (no insurance) in [facility]:  Organization         Address  Phone   Notes  Sickle Cell Patients, [facility] [address] [place], [place] [phone]   [facility] [address] [place], [place] [phone]   [facility]  [address],  [place] [phone]   [facility]/Dr. Osei-Bonsu  [address] [place] or [address], [place] [phone] Phone number for both [place] and  [place] locations is the same.  [facility]gent [facility] [address] [place] [phone]   [facility] [address] [place] or [address] [phone]   [facility] [address] [place] [phone], phone; [phone], fax Sees patients 1st and 3rd Saturday of every month.  Must not qualify for public or private insurance (i.e. Medicaid, Medicare, [place] Health Choice, Veterans' Benefits)  Household income should be no more than 200% of the poverty level The clinic cannot treat you if you are pregnant or think you are pregnant  Sexually transmitted diseases are not treated at the clinic.    Dental Care: Organization         Address  Phone  Notes  [facility] [address] [place], [place] [phone] Accepts children up to age 65 who are enrolled in [place] or [place] Health Choice; pregnant women with a Medicaid card; and children who have applied for Medicaid or [place] Health Choice, but were declined, whose parents can pay a reduced fee at time of service.  [facility]  [address], [place] [phone] Accepts children up to age 58 who are enrolled in [place] or [place] Health Choice; pregnant women with a Medicaid card; and children who have applied for Medicaid or [place] Health Choice, but were declined, whose parents can pay a reduced fee at time of service.  [facility]  [address] [place], [place] [phone] Patients are seen by appointment only. Walk-ins are not accepted. [facility] will see patients 69 years of age and older. Monday - Tuesday (8am-5pm) Most Wednesdays (8:30-5pm) $30 per visit, cash only  [facility] Adult Dental Access PROGRAM  [address], [facility] [phone] Patients are seen by appointment only. Walk-ins are not accepted.  [facility] will see patients 2 years of age and older. One Wednesday Evening (Monthly: Volunteer Based).  $30 per visit, cash only  [employer]  [phone] for adults; Children under age 14, call Graduate Pediatric Dentistry at [phone]. Children aged 75-14, please call [phone] to request a pediatric application.  Dental services are provided in all areas of dental care including fillings, crowns and bridges, complete and partial  dentures, implants, gum treatment, root canals, and extractions. Preventive care is also provided. Treatment is provided to both adults and children. Patients are selected via a lottery and there is often a waiting list.   [facility] [address], [place]  [phone] [email or web address]   [facility] [address] [place], [place] [phone], Ext. [phone] Second and Fourth Thursday of each month, opens at 6:30 AM; Clinic ends at 9 AM.  Patients are seen on a first-come first-served basis, and a limited number are seen during each clinic.   [facility]  [address] Ether Griffins [place], [place] [phone]   Eligibility Requirements You must have lived in [place], [place], or [place] counties for at least the last three months.   You cannot be eligible for state or federal sponsored [place], including [employer], [place], or [employer].   You generally cannot be eligible for healthcare insurance through your employer.    How to apply: Eligibility screenings are held every Tuesday and Wednesday afternoon from 1:00 pm until 4:00 pm. You do not need an appointment for the interview!  [facility] [address] [place], [place] [phone]   [facility] Health Department  [phone]   [facility] Health Department  [phone]   [facility] Health Department  [phone]    Behavioral Health Resources in the  Community: Intensive Outpatient Programs Organization         Address  Phone  Notes  [facility] Services [address], [place], [place] [phone]   [facility] [address] [place], [place] [phone]   [facility] [address], [place], [place]  [phone]   [facility] Mental Health [address]  [place], [place] [phone] or [phone]   Substance Abuse Resources Organization         Address  Phone  Notes  Al[facility] Drug Services  [phone]   [facility]  [phone]   [facility]  [phone]   Floydene Flock  [phone]   Residential & Outpatient Substance Abuse Program  [phone]   Psychological Services Organization         Address  Phone  Notes  [facility]  [phone]   [facility] Services  [phone]   [facility] Mental Health [address], [place] [phone] or [phone]    Mobile Crisis Teams Organization         Address  Phone  Notes  [facility], Mobile Crisis Care Unit  [phone]   [facility]  [address] [place], [place] [phone]   Doristine Locks [address] [place] [phone]    Self-Help/Support Groups Organization         Address  Phone             Notes  [facility] [place] - variety of support groups  [phone]- I7437963 Call for more information  Narcotics Anonymous (NA), [facility] [address], [employer] [place]  2 meetings at this location   Statistician         Address  Phone  Notes  [facility] [address],    [place]  [phone]   [facility]  [address] [place] [address], [place], [place] [phone]   [facility] Treatment Facility [address] [place], [place] [phone] Admissions: 8am-3pm M-F  Incentives  [facility]stance Abuse Treatment Center [address]    [place], [place] [phone]   [facility] [address] [place], [place], [place] [phone]   [facility] [address]  [place], [place] [phone]   [facility] - Intensive Outp[address], [place], [place] [phone]   [facility] ([facility]iction [facility] [address] [place]  [place], [place] [phone] or [phone]   [facility] (RT[facility]) [address] [place], [place] [phone] Accepts Medicaid  [facility] [address]  [place] [phone] Substance Abuse/Addiction Treatment   [facility] Organization         Address  Phone  Notes  [facility]  [phone]   Angie Fava, PhD [address] Ervin Knack [place], [place]   [phone] or [phone]   [facility] Behavioral   [address] [place], [place] [phone]   [facility] [address], [place], [place] [phone] Insurance/Medicaid/sponsorship through [facility] and Families [address]                                    [place], [place] [phone] Therapy/tele-psych/case  [facility] [address], [place] [phone]    Dr. Lolly Mustache  [phone]   [facility]  [employer] [facility] Dept. 1) [address] [place] 2) [address] [place] 3)  [address], [place] [phone]  [phone]   [facility] Child Abuse Hotline (816)114-2633 or [phone] (After Hours)

## 2015-02-11 NOTE — ED Provider Notes (Signed)
History  By signing my name below, I, Karle PlumberJennifer Tensley, attest that this documentation has been prepared under the direction and in the presence of Glean HessElizabeth Zoeya Gramajo, [place]PA-C. Electronically Signed: Karle PlumberJennifer Tensley, ED Scribe. 02/11/2015. 3:03 PM.  Chief Complaint  Patient presents with  . Hand Pain    pain and swelling in 4th finger of r/hand   The history is provided by the patient and medical records. No language interpreter was used.     HPI Comments:  Luis Carson is a 39 y.o. male with no pertinent PMH who presents to the ED with of constant right fourth finger pain and swelling, which began approximately two days ago. He denies recent injury, though states he may have inadvertently hit his finger at work. He has tried tylenol for pain with no significant symptom relief. He reports movement exacerbates his pain. He denies fever, chills, nausea, vomiting, numbness, weakness, paresthesia.  History reviewed. No pertinent past medical history. History reviewed. No pertinent past surgical history. History reviewed. No pertinent family history. Social History  Substance Use Topics  . Smoking status: Current Some Day Smoker  . Smokeless tobacco: None  . Alcohol Use: No      Review of Systems  Constitutional: Negative for fever and chills.  Musculoskeletal: Positive for joint swelling and arthralgias.  Skin: Negative for color change.  Neurological: Negative for weakness and numbness.    Allergies  Review of patient's allergies indicates no known allergies.  Home Medications   Prior to Admission medications   Medication Sig Start Date End Date Taking? Authorizing Provider  DM-Doxylamine-Acetaminophen (NYQUIL COLD & FLU PO) Take 1 capsule by mouth once.    Historical Provider, MD  ibuprofen (ADVIL,MOTRIN) 800 MG tablet Take 1 tablet (800 mg total) by mouth 3 (three) times daily. 02/11/15   Mady GemmaElizabeth C Elease Swarm, PA-C  oxyCODONE-acetaminophen (PERCOCET) 5-325 MG per tablet  Take 1-2 tablets every 4-6 hours as needed for pain Patient not taking: Reported on 08/25/2014 07/16/12   Lowell BoutonBarbara A Beck, PA-C  oxyCODONE-acetaminophen (PERCOCET) 5-325 MG per tablet Take 1 tablet by mouth every 6 (six) hours as needed. Patient not taking: Reported on 08/25/2014 06/29/13   April Palumbo, MD  Phenylephrine-DM-GG-APAP 5-10-200-325 MG TABS Take 1 capsule by mouth once.    Historical Provider, MD    Triage Vitals: BP 117/80 mmHg  Pulse 61  Temp(Src) 98.3 F (36.8 C) (Oral)  Resp 18  SpO2 100% Physical Exam  Constitutional: He is oriented to person, place, and time. He appears well-developed and well-nourished. No distress.  HENT:  Head: Normocephalic and atraumatic.  Right Ear: External ear normal.  Left Ear: External ear normal.  Nose: Nose normal.  Mouth/Throat: Oropharynx is clear and moist.  Eyes: Conjunctivae and EOM are normal. Pupils are equal, round, and reactive to light. Right eye exhibits no discharge. Left eye exhibits no discharge. No scleral icterus.  Neck: Normal range of motion. Neck supple.  Cardiovascular: Normal rate and regular rhythm.   Pulmonary/Chest: Effort normal and breath sounds normal. No respiratory distress.  Musculoskeletal: Normal range of motion. He exhibits edema and tenderness.  TTP of right 4th DIP with mild edema and decreased range of motion due to pain. No erythema or warmth. Strength and sensation intact. Distal pulses intact. Cap refill <3 seconds.  Neurological: He is alert and oriented to person, place, and time.  Skin: Skin is warm and dry. He is not diaphoretic.  Psychiatric: He has a normal mood and affect. His behavior is normal.  Nursing  note and vitals reviewed.   ED Course  Procedures (including critical care time)  DIAGNOSTIC STUDIES: Oxygen Saturation is 100% on RA, normal by my interpretation.   COORDINATION OF CARE: 2:34 PM- Will X-Ray right hand and order Ibuprofen prior to imaging. Pt verbalizes understanding and  agrees to plan.  Medications  ibuprofen (ADVIL,MOTRIN) tablet 800 mg (800 mg Oral Given 02/11/15 1458)    Labs Review Labs Reviewed - No data to display  Imaging Review Dg Hand Complete Right  02/11/2015  CLINICAL DATA:  39 year old male with pain and stiffness in the ring finger of the right hand. No history of injury. EXAM: RIGHT HAND - COMPLETE 3+ VIEW COMPARISON:  No priors. FINDINGS: No acute displaced fracture. 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 In the fourth DIP joint there is joint space narrowing, subchondral sclerosis and osteophyte formation, suggesting osteoarthritis. There is also some dorsal soft tissue swelling at this level. Old healed fracture at the base of the fourth metacarpal with mild posttraumatic deformity, and old healed ulnar styloid fracture with posttraumatic deformity. IMPRESSION: 1. Degenerative changes of osteoarthritis, as above, most pronounced at the fourth DIP joint, where there is also some dorsal soft tissue swelling. 2. Posttraumatic changes, as above. Electronically Signed   By: Trudie Reed M.D.   On: 02/11/2015 14:57   I have personally reviewed and evaluated these images as part of my medical decision-making.   EKG Interpretation None      MDM   Final diagnoses:  Pain of finger of right hand    39 year old male presents with right 4th finger pain and swelling, which started 2 days ago. Denies numbness, weakness, paresthesia. Denies recent injury, though states he may have inadvertently hit his finger at work. Patient is afebrile. Vital signs stable. TTP of right 4th DIP with mild edema and decreased range of motion due to pain. No erythema or warmth. Strength and sensation intact. Distal pulses intact. Cap refill <3 seconds. Imaging remarkable for degenerative changes of osteoarthritis, dorsal soft tissue swelling. Will treat with finger splint, ibuprofen, and RICE therapy. Patient to  follow-up with PCP. Return precautions discussed. Patient verbalizes his understanding and is in agreement with plan.  BP 117/80 mmHg  Pulse 67  Temp(Src) 98.3 F (36.8 C) (Oral)  Resp 18  SpO2 100%  I personally performed the services described in this documentation, which was scribed in my presence. The recorded information has been reviewed and is accurate.       Mady Gemma, PA-C 02/11/15 2329  Benjiman Core, MD 02/12/15 [phone]

## 2015-02-11 NOTE — ED Notes (Signed)
2 day hx of increased swelling and tendeness in 4th finger of r/hand. Swelling is from nail bed to 1st joint

## 2015-02-22 ENCOUNTER — Emergency Department (HOSPITAL_COMMUNITY)
Admission: EM | Admit: 2015-02-22 | Discharge: 2015-02-22 | Disposition: A | Discharge status: Home / Self Care | Payer: Self-pay | Attending: Emergency Medicine | Admitting: Emergency Medicine

## 2015-02-22 ENCOUNTER — Encounter (HOSPITAL_COMMUNITY): Payer: Self-pay | Admitting: Nurse Practitioner

## 2015-02-22 DIAGNOSIS — L03011 Cellulitis of right finger: Secondary | ICD-10-CM | POA: Insufficient documentation

## 2015-02-22 DIAGNOSIS — Z8781 Personal history of (healed) traumatic fracture: Secondary | ICD-10-CM | POA: Insufficient documentation

## 2015-02-22 DIAGNOSIS — F17200 Nicotine dependence, unspecified, uncomplicated: Secondary | ICD-10-CM | POA: Insufficient documentation

## 2015-02-22 DIAGNOSIS — Z791 Long term (current) use of non-steroidal anti-inflammatories (NSAID): Secondary | ICD-10-CM | POA: Insufficient documentation

## 2015-02-22 DIAGNOSIS — L03818 Cellulitis of other sites: Secondary | ICD-10-CM

## 2015-02-22 MED ORDER — CEPHALEXIN 500 MG PO CAPS: 500.0000 mg | ORAL_CAPSULE | Freq: Four times a day (QID) | ORAL | Status: DC

## 2015-02-22 NOTE — Discharge Instructions (Signed)

## 2015-02-22 NOTE — ED Notes (Signed)
Pt presents for reevaluation of her right ring finger which he reports the swelling has worsened since he was evaluated a week ago. C/o pain 8/10.

## 2015-02-22 NOTE — ED Provider Notes (Signed)
CSN: [id number]     Arrival date & time 02/22/15  1628 History  By signing my name below, I, Luis Carson, attest that this documentation has been prepared under the direction and in the presence of Cheri Fowler, PA-C. Electronically Signed: Placido Carson, ED Scribe. 02/22/2015. 5:03 PM.   Chief Complaint  Patient presents with  . Finger Swelling     Right Ring Finger   The history is provided by the patient. No language interpreter was used.    HPI Comments: Luis Carson is a 39 y.o. male, who was seen on 11/05 for right 4th finger pain, presents to the Emergency Department complaining of worsening, mild, right 4th finger pain and swelling with onset 2 weeks ago. Pt notes that he was clipping his fingernails and accidentally over-cut the nail of the affected finger before the onset of his current symptoms. Pt notes some associated, mild, irritation and a point of discoloration to the skin of the swollen region. He notes taking ibuprofen as needed for pain management which provides short term relief. He notes a hx of fracture to the affected finger, so it "is always a little crooked and not straightened out". Pt denies numbness, tingling, fevers and chills.   History reviewed. No pertinent past medical history. History reviewed. No pertinent past surgical history. History reviewed. No pertinent family history. Social History  Substance Use Topics  . Smoking status: Current Some Day Smoker  . Smokeless tobacco: None  . Alcohol Use: No    Review of Systems A complete 10 system review of systems was obtained and all systems are negative except as noted in the HPI and PMH.  Allergies  Review of patient's allergies indicates no known allergies.  Home Medications   Prior to Admission medications   Medication Sig Start Date End Date Taking? Authorizing Provider  cephALEXin (KEFLEX) 500 MG capsule Take 1 capsule (500 mg total) by mouth 4 (four) times daily. 02/22/15   Cheri Fowler, PA-C   DM-Doxylamine-Acetaminophen (NYQUIL COLD & FLU PO) Take 1 capsule by mouth once.    Historical Provider, MD  ibuprofen (ADVIL,MOTRIN) 800 MG tablet Take 1 tablet (800 mg total) by mouth 3 (three) times daily. 02/11/15   Mady Gemma, PA-C  oxyCODONE-acetaminophen (PERCOCET) 5-325 MG per tablet Take 1-2 tablets every 4-6 hours as needed for pain Patient not taking: Reported on 08/25/2014 07/16/12   Lowell Bouton, PA-C  oxyCODONE-acetaminophen (PERCOCET) 5-325 MG per tablet Take 1 tablet by mouth every 6 (six) hours as needed. Patient not taking: Reported on 08/25/2014 06/29/13   April Palumbo, MD  Phenylephrine-DM-GG-APAP 5-10-200-325 MG TABS Take 1 capsule by mouth once.    Historical Provider, MD   BP 137/80 mmHg  Pulse 72  Temp(Src) 98.2 F (36.8 C) (Oral)  Resp 20  SpO2 99% Physical Exam  Constitutional: He is oriented to person, place, and time. He appears well-developed and well-nourished.  HENT:  Head: Normocephalic and atraumatic.  Mouth/Throat: Oropharynx is clear and moist. No oropharyngeal exudate.  Eyes: Conjunctivae are normal.  Neck: Normal range of motion. Neck supple. No tracheal deviation present.  Cardiovascular: Normal rate, regular rhythm, normal heart sounds and intact distal pulses.   Pulses:      Radial pulses are 2+ on the right side, and 2+ on the left side.  Capillary refill less than 3 seconds  Pulmonary/Chest: Effort normal and breath sounds normal. No respiratory distress. He has no wheezes. He has no rales.  Abdominal: Soft. Bowel sounds are normal.  He exhibits no distension. There is no tenderness.  Musculoskeletal: Normal range of motion.       Hands: Neurological: He is alert and oriented to person, place, and time.  Strength and sensation intact bilaterally throughout upper and lower extremities.   Skin: Skin is warm and dry. He is not diaphoretic.  Psychiatric: He has a normal mood and affect. His behavior is normal.  Nursing note and vitals  reviewed.  ED Course  Procedures  DIAGNOSTIC STUDIES: Oxygen Saturation is 99% on RA, normal by my interpretation.    COORDINATION OF CARE: 5:00 PM Pt presents today due to worsening right fourth finger pain and swelling. Discussed next steps with pt at bedside including 1x ibuprofen and reevaluation by attending physician before determination of treatment plan. Pt agreed to plan.  Labs Review Labs Reviewed - No data to display  Imaging Review No results found.   EKG Interpretation None      MDM   Final diagnoses:  Cellulitis of other specified site    Patient presents with persistent right ring finger swelling and mild pain.  Taking ibuprofen as prescribed, but now noticed some white discoloration.  No drainage.  VSS, NAD.  On exam, mild swelling and TTP of DIP joint.  Area of discoloration in between DIP and nail bed, suspect underlying pus and possible cellulitis.  Doubt abscess formation  No nail fold, bed, or lateral nail fold involvement.  No involvement of the distal finger pad.  Doubt paronychia.  Doubt felon.   Neurovascularly intact.  Capillary refill less than 3 seconds.   I personally performed the services described in this documentation, which was scribed in my presence. The recorded information has been reviewed and is accurate.    Cheri FowlerKayla Takiera Mayo, PA-C 02/22/15 1800  Benjiman CoreNathan Pickering, MD 02/23/15 [phone]

## 2017-09-13 ENCOUNTER — Emergency Department (HOSPITAL_COMMUNITY)
Admission: EM | Admit: 2017-09-13 | Discharge: 2017-09-13 | Disposition: A | Discharge status: Home / Self Care | Payer: Self-pay | Attending: Emergency Medicine | Admitting: Emergency Medicine

## 2017-09-13 ENCOUNTER — Other Ambulatory Visit: Payer: Self-pay

## 2017-09-13 ENCOUNTER — Emergency Department (HOSPITAL_COMMUNITY): Payer: Self-pay

## 2017-09-13 DIAGNOSIS — E875 Hyperkalemia: Secondary | ICD-10-CM

## 2017-09-13 DIAGNOSIS — Z86718 Personal history of other venous thrombosis and embolism: Secondary | ICD-10-CM | POA: Insufficient documentation

## 2017-09-13 DIAGNOSIS — R42 Dizziness and giddiness: Secondary | ICD-10-CM | POA: Insufficient documentation

## 2017-09-13 DIAGNOSIS — R55 Syncope and collapse: Secondary | ICD-10-CM

## 2017-09-13 DIAGNOSIS — F17200 Nicotine dependence, unspecified, uncomplicated: Secondary | ICD-10-CM | POA: Insufficient documentation

## 2017-09-13 LAB — CBC
HCT: 48.5 % (ref 39.0–52.0)
Hemoglobin: 15.8 g/dL (ref 13.0–17.0)
MCH: 27.7 pg (ref 26.0–34.0)
MCHC: 32.6 g/dL (ref 30.0–36.0)
MCV: 85.1 fL (ref 78.0–100.0)
PLATELETS: 233 10*3/uL (ref 150–400)
RBC: 5.7 MIL/uL (ref 4.22–5.81)
RDW: 14.7 % (ref 11.5–15.5)
WBC: 6.4 10*3/uL (ref 4.0–10.5)

## 2017-09-13 LAB — I-STAT CHEM 8, ED
BUN: 24 mg/dL — ABNORMAL HIGH (ref 6–20)
CALCIUM ION: 1.11 mmol/L — AB (ref 1.15–1.40)
CREATININE: 1.2 mg/dL (ref 0.61–1.24)
Chloride: 104 mmol/L (ref 101–111)
Glucose, Bld: 103 mg/dL — ABNORMAL HIGH (ref 65–99)
HCT: 46 % (ref 39.0–52.0)
HEMOGLOBIN: 15.6 g/dL (ref 13.0–17.0)
Potassium: 5.6 mmol/L — ABNORMAL HIGH (ref 3.5–5.1)
Sodium: 137 mmol/L (ref 135–145)
TCO2: 26 mmol/L (ref 22–32)

## 2017-09-13 LAB — BASIC METABOLIC PANEL
Anion gap: 6 (ref 5–15)
BUN: 16 mg/dL (ref 6–20)
CO2: 25 mmol/L (ref 22–32)
CREATININE: 1.24 mg/dL (ref 0.61–1.24)
Calcium: 8.5 mg/dL — ABNORMAL LOW (ref 8.9–10.3)
Chloride: 105 mmol/L (ref 101–111)
GFR calc Af Amer: >60 mL/min (ref >60)
GLUCOSE: 103 mg/dL — AB (ref 65–99)
Potassium: 5.8 mmol/L — ABNORMAL HIGH (ref 3.5–5.1)
SODIUM: 136 mmol/L (ref 135–145)

## 2017-09-13 LAB — I-STAT TROPONIN, ED: TROPONIN I, POC: 0 ng/mL (ref 0.00–0.08)

## 2017-09-13 LAB — CBG MONITORING, ED: GLUCOSE-CAPILLARY: 100 mg/dL — AB (ref 65–99)

## 2017-09-13 MED ORDER — SODIUM POLYSTYRENE SULFONATE 15 GM/60ML PO SUSP
30.0000 g | Freq: Once | ORAL | Status: AC
  Administered 2017-09-13: 30 g via ORAL
  Filled 2017-09-13: qty 120

## 2017-09-13 MED ORDER — SODIUM CHLORIDE 0.9 % IV BOLUS
1000.0000 mL | Freq: Once | INTRAVENOUS | Status: AC
  Administered 2017-09-13: 1000 mL via INTRAVENOUS

## 2017-09-13 MED ORDER — IOPAMIDOL (ISOVUE-370) INJECTION 76%
100.0000 mL | Freq: Once | INTRAVENOUS | Status: AC | PRN
  Administered 2017-09-13: 100 mL via INTRAVENOUS

## 2017-09-13 NOTE — Discharge Instructions (Addendum)
Your potassium was elevated today. This is likely from dehydration - drink at least 8 glasses of water daily. Avoid foods high in potassium. Follow-up in 1 week for repeat levels.

## 2017-09-13 NOTE — ED Triage Notes (Signed)
Pt from home via [facility]. C/o of near syncopal episode when he got up to walk. Sts was sitting, got hot and diaphoretic, but when he stood up to walk, felt faint and fell. Denies LOC, denies pain @ this time. Hx of DVT on Xarelto. A&O x4 on arrival.

## 2017-09-13 NOTE — ED Provider Notes (Signed)
[facility] Provider Note   CSN: [id number] Arrival date & time: 09/13/17  0159     History   Chief Complaint Chief Complaint  Patient presents with  . Near Syncope    HPI Vincent Grosunorial [place] is a 42 y.o. male.  HPI  42 yo M with PMHx DVT on Xarelto here with near syncopal episode.  Patient states that he was in his usual state of health throughout the day yesterday.  He was sitting on the hardwood floor on his buttocks feeding his dogs.  He states he was sitting for approximately 1 hour.  Then began to feel tingling in his legs due to how he was sitting.  He stood up and felt lightheaded.  He tried to move to the other room and began to feel very flushed and lightheaded.  He had to sit down.  He then stood back up and the symptoms return.  He did not actually lose consciousness.  Denies any associated chest pain, shortness of breath, or palpitations.  He has a history of previous similar symptoms and is passed out multiple times.  He does endorse not drinking enough water.  No fevers or chills.  No recent medication changes.  Is been taking his Xarelto as prescribed.  Of note, however, he did run out of it approximately a month ago, and went several weeks without it.  Denies any increase in his leg swelling.    No past medical history on file.  There are no active problems to display for this patient.   No past surgical history on file.      Home Medications    Prior to Admission medications   Medication Sig Start Date End Date Taking? Authorizing Provider  cephALEXin (KEFLEX) 500 MG capsule Take 1 capsule (500 mg total) by mouth 4 (four) times daily. 02/22/15   Cheri Fowlerose, Kayla, PA-C  DM-Doxylamine-Acetaminophen (NYQUIL COLD & FLU PO) Take 1 capsule by mouth once.    [provider]  ibuprofen (ADVIL,MOTRIN) 800 MG tablet Take 1 tablet (800 mg total) by mouth 3 (three) times daily. 02/11/15   Mady GemmaWestfall, Elizabeth C, PA-C    oxyCODONE-acetaminophen (PERCOCET) 5-325 MG per tablet Take 1-2 tablets every 4-6 hours as needed for pain Patient not taking: Reported on 08/25/2014 07/16/12   Lowell BoutonBeck, Barbara A, PA-C  oxyCODONE-acetaminophen (PERCOCET) 5-325 MG per tablet Take 1 tablet by mouth every 6 (six) hours as needed. Patient not taking: Reported on 08/25/2014 06/29/13   Palumbo, April, MD  Phenylephrine-DM-GG-APAP 5-10-200-325 MG TABS Take 1 capsule by mouth once.    [provider]    Family History No family history on file.  Social History Social History   Tobacco Use  . Smoking status: Current Some Day Smoker  Substance Use Topics  . Alcohol use: No  . Drug use: No     Allergies   Patient has no known allergies.   Review of Systems Review of Systems  Constitutional: Negative for chills, fatigue and fever.  HENT: Negative for congestion and rhinorrhea.   Eyes: Negative for visual disturbance.  Respiratory: Negative for cough, shortness of breath and wheezing.   Cardiovascular: Negative for chest pain and leg swelling.  Gastrointestinal: Negative for abdominal pain, diarrhea, nausea and vomiting.  Genitourinary: Negative for dysuria and flank pain.  Musculoskeletal: Negative for neck pain and neck stiffness.  Skin: Negative for rash and wound.  Allergic/Immunologic: Negative for immunocompromised state.  Neurological: Positive for syncope and light-headedness. Negative for weakness  and headaches.  All other systems reviewed and are negative.    Physical Exam Updated Vital Signs BP 110/69   Pulse 72   Temp 98.5 F (36.9 C) (Oral)   Resp 14   Ht 6\' 3"  (1.905 m)   Wt 108.9 kg (240 lb)   SpO2 98%   BMI 30.00 kg/m   Physical Exam  Constitutional: He is oriented to person, place, and time. He appears well-developed and well-nourished. No distress.  HENT:  Head: Normocephalic and atraumatic.  Eyes: Conjunctivae are normal.  Neck: Neck supple.  Cardiovascular: Normal rate, regular  rhythm and normal heart sounds. Exam reveals no friction rub.  No murmur heard. Pulmonary/Chest: Effort normal and breath sounds normal. No respiratory distress. He has no wheezes. He has no rales.  Abdominal: Soft. Bowel sounds are normal. He exhibits no distension.  Musculoskeletal: He exhibits no edema (No edema or leg tenderness).  Neurological: He is alert and oriented to person, place, and time. He exhibits normal muscle tone.  Skin: Skin is warm. Capillary refill takes less than 2 seconds.  Psychiatric: He has a normal mood and affect.  Nursing note and vitals reviewed.    ED Treatments / Results  Labs (all labs ordered are listed, but only abnormal results are displayed) Labs Reviewed  BASIC METABOLIC PANEL - Abnormal; Notable for the following components:      Result Value   Potassium 5.8 (*)    Glucose, Bld 103 (*)    Calcium 8.5 (*)    All other components within normal limits  CBG MONITORING, ED - Abnormal; Notable for the following components:   Glucose-Capillary 100 (*)    All other components within normal limits  I-STAT CHEM 8, ED - Abnormal; Notable for the following components:   Potassium 5.6 (*)    BUN 24 (*)    Glucose, Bld 103 (*)    Calcium, Ion 1.11 (*)    All other components within normal limits  CBC  URINALYSIS, ROUTINE W REFLEX MICROSCOPIC  I-STAT TROPONIN, ED    EKG EKG Interpretation  Date/Time:  Saturday September 13 2017 02:13:25 EDT Ventricular Rate:  68 PR Interval:    QRS Duration: 80 QT Interval:  376 QTC Calculation: 400 R Axis:   36 Text Interpretation:  Sinus rhythm Prolonged PR interval Probable left atrial enlargement Consider anterolateral infarct Baseline wander in lead(s) V1 No old tracing to compare Confirmed by Shaune Pollack [phone]) on 09/13/2017 5:15:38 AM   Radiology Ct Angio Chest Pe W Or Wo Contrast  Result Date: 09/13/2017 CLINICAL DATA:  Acute onset of syncope. Concern for pulmonary embolus. EXAM: CT ANGIOGRAPHY CHEST  WITH CONTRAST TECHNIQUE: Multidetector CT imaging of the chest was performed using the standard protocol during bolus administration of intravenous contrast. Multiplanar CT image reconstructions and MIPs were obtained to evaluate the vascular anatomy. CONTRAST:  ISOVUE-370 IOPAMIDOL (ISOVUE-370) INJECTION 76% COMPARISON:  Chest radiograph performed 08/25/2014 FINDINGS: Cardiovascular:  There is no evidence of pulmonary embolus. The heart is borderline normal in size. The great vessels are grossly unremarkable. The thoracic aorta is grossly unremarkable. Mediastinum/Nodes: The mediastinum is unremarkable. No mediastinal lymphadenopathy is seen. No pericardial effusion is identified. The visualized portions of the thyroid gland are within normal limits. No axillary lymphadenopathy is seen. Lungs/Pleura: Minimal bilateral dependent subsegmental atelectasis is noted. No pleural effusion or pneumothorax is seen. No masses are identified. Upper Abdomen: The visualized portions of the liver and spleen are grossly unremarkable. Musculoskeletal: No acute osseous abnormalities are  identified. The visualized musculature is unremarkable in appearance. Review of the MIP images confirms the above findings. IMPRESSION: 1. No evidence of pulmonary embolus. 2. Minimal bilateral dependent subsegmental atelectasis noted; lungs otherwise clear. Electronically Signed   By: Roanna Raider M.D.   On: 09/13/2017 05:17    Procedures Procedures (including critical care time)  Medications Ordered in ED Medications  sodium chloride 0.9 % bolus 1,000 mL (0 mLs Intravenous Stopped 09/13/17 0328)  iopamidol (ISOVUE-370) 76 % injection 100 mL (100 mLs Intravenous Contrast Given 09/13/17 0439)  sodium polystyrene (KAYEXALATE) 15 GM/60ML suspension 30 g (30 g Oral Given 09/13/17 1610)     Initial Impression / Assessment and Plan / ED Course  I have reviewed the triage vital signs and the nursing notes.  Pertinent labs & imaging  results that were available during my care of the patient were reviewed by me and considered in my medical decision making (see chart for details).    42 year old male with past medical history as above here with near syncopal episode.  I suspect this was vasovagal versus orthostatic in the setting of sitting down for an extended amount of time in a hot room.  He had an appropriate prodrome.  His symptoms are now resolved.  However, given his history of DVT off of his Xarelto, PE study obtained and is negative.  His lab work is otherwise unremarkable.  His BMP did show an incidental potassium of 5.8.  I suspect this is likely due to hemolysis, and he has no EKG changes.  No high risk medication use.  He is not on an ace inhibitor or ARB.  Nonetheless, given that it is over 5.5, will give him a dose of Kayexalate, and he was given fluids.  Will have him avoid high potassium foods and have this repeated in a week with good return precautions.  Final Clinical Impressions(s) / ED Diagnoses   Final diagnoses:  Near syncope  Hyperkalemia    ED Discharge Orders    None       Shaune Pollack, MD 09/13/17 [phone]

## 2017-11-05 ENCOUNTER — Emergency Department (HOSPITAL_COMMUNITY): Payer: Self-pay

## 2017-11-05 ENCOUNTER — Emergency Department (HOSPITAL_COMMUNITY)
Admission: EM | Admit: 2017-11-05 | Discharge: 2017-11-05 | Disposition: A | Discharge status: Home / Self Care | Payer: Self-pay | Attending: Emergency Medicine | Admitting: Emergency Medicine

## 2017-11-05 ENCOUNTER — Encounter (HOSPITAL_COMMUNITY): Payer: Self-pay | Admitting: Emergency Medicine

## 2017-11-05 DIAGNOSIS — K5792 Diverticulitis of intestine, part unspecified, without perforation or abscess without bleeding: Secondary | ICD-10-CM

## 2017-11-05 DIAGNOSIS — Z7901 Long term (current) use of anticoagulants: Secondary | ICD-10-CM | POA: Insufficient documentation

## 2017-11-05 DIAGNOSIS — K5732 Diverticulitis of large intestine without perforation or abscess without bleeding: Secondary | ICD-10-CM | POA: Insufficient documentation

## 2017-11-05 DIAGNOSIS — F17200 Nicotine dependence, unspecified, uncomplicated: Secondary | ICD-10-CM | POA: Insufficient documentation

## 2017-11-05 LAB — URINALYSIS, ROUTINE W REFLEX MICROSCOPIC
BACTERIA UA: NONE SEEN
BILIRUBIN URINE: NEGATIVE
GLUCOSE, UA: NEGATIVE mg/dL
KETONES UR: NEGATIVE mg/dL
Leukocytes, UA: NEGATIVE
Nitrite: NEGATIVE
PH: 5 (ref 5.0–8.0)
Protein, ur: NEGATIVE mg/dL
Specific Gravity, Urine: 1.019 (ref 1.005–1.030)

## 2017-11-05 LAB — CBC
HEMATOCRIT: 47.7 % (ref 39.0–52.0)
Hemoglobin: 16 g/dL (ref 13.0–17.0)
MCH: 28.1 pg (ref 26.0–34.0)
MCHC: 33.5 g/dL (ref 30.0–36.0)
MCV: 83.7 fL (ref 78.0–100.0)
Platelets: 188 10*3/uL (ref 150–400)
RBC: 5.7 MIL/uL (ref 4.22–5.81)
RDW: 14.1 % (ref 11.5–15.5)
WBC: 9.2 10*3/uL (ref 4.0–10.5)

## 2017-11-05 LAB — COMPREHENSIVE METABOLIC PANEL
ALT: 14 U/L (ref 0–44)
AST: 20 U/L (ref 15–41)
Albumin: 4 g/dL (ref 3.5–5.0)
Alkaline Phosphatase: 50 U/L (ref 38–126)
Anion gap: 10 (ref 5–15)
BILIRUBIN TOTAL: 0.7 mg/dL (ref 0.3–1.2)
BUN: 13 mg/dL (ref 6–20)
CO2: 25 mmol/L (ref 22–32)
Calcium: 8.9 mg/dL (ref 8.9–10.3)
Chloride: 102 mmol/L (ref 98–111)
Creatinine, Ser: 1.3 mg/dL — ABNORMAL HIGH (ref 0.61–1.24)
GFR calc Af Amer: >60 mL/min (ref >60)
Glucose, Bld: 96 mg/dL (ref 70–99)
Potassium: 3.8 mmol/L (ref 3.5–5.1)
Sodium: 137 mmol/L (ref 135–145)
Total Protein: 7.7 g/dL (ref 6.5–8.1)

## 2017-11-05 LAB — LIPASE, BLOOD: Lipase: 18 U/L (ref 11–51)

## 2017-11-05 MED ORDER — CIPROFLOXACIN HCL 500 MG PO TABS: 500.0000 mg | ORAL_TABLET | Freq: Two times a day (BID) | ORAL | 0 refills | Status: AC

## 2017-11-05 MED ORDER — MORPHINE SULFATE (PF) 2 MG/ML IV SOLN
2.0000 mg | Freq: Once | INTRAVENOUS | Status: AC
  Administered 2017-11-05: 2 mg via INTRAVENOUS
  Filled 2017-11-05: qty 1

## 2017-11-05 MED ORDER — IOPAMIDOL (ISOVUE-300) INJECTION 61%
INTRAVENOUS | Status: AC
  Filled 2017-11-05: qty 100

## 2017-11-05 MED ORDER — SODIUM CHLORIDE 0.9 % IV BOLUS
1000.0000 mL | Freq: Once | INTRAVENOUS | Status: AC
  Administered 2017-11-05: 1000 mL via INTRAVENOUS

## 2017-11-05 MED ORDER — METRONIDAZOLE 500 MG PO TABS: 500.0000 mg | ORAL_TABLET | Freq: Three times a day (TID) | ORAL | 0 refills | Status: AC

## 2017-11-05 MED ORDER — ONDANSETRON HCL 4 MG/2ML IJ SOLN
4.0000 mg | Freq: Once | INTRAMUSCULAR | Status: AC
  Administered 2017-11-05: 4 mg via INTRAVENOUS
  Filled 2017-11-05: qty 2

## 2017-11-05 MED ORDER — IOPAMIDOL (ISOVUE-300) INJECTION 61%
100.0000 mL | Freq: Once | INTRAVENOUS | Status: AC | PRN
  Administered 2017-11-05: 100 mL via INTRAVENOUS

## 2017-11-05 MED ORDER — METRONIDAZOLE 500 MG PO TABS
500.0000 mg | ORAL_TABLET | Freq: Once | ORAL | Status: AC
  Administered 2017-11-05: 500 mg via ORAL
  Filled 2017-11-05: qty 1

## 2017-11-05 MED ORDER — CIPROFLOXACIN HCL 500 MG PO TABS
500.0000 mg | ORAL_TABLET | Freq: Once | ORAL | Status: AC
  Administered 2017-11-05: 500 mg via ORAL
  Filled 2017-11-05: qty 1

## 2017-11-05 NOTE — Discharge Instructions (Addendum)
Your CT scan showed signs of diverticulitis. It is treated with 2 antibiotics, Cipro and Flagyl. You have received one dose of these medications today. Starting tomorrow, you will take both medications for 7 days each.  I have provided information on diverticulitis which includes information on what you can eat while you are being treated. I recommended a liquid diet for the first 2 days and then ease your way back into normal foods.

## 2017-11-05 NOTE — ED Provider Notes (Signed)
[facility]-EMERGENCY DEPT Provider Note  CSN: [id number] Arrival date & time: 11/05/17  1715  History   Chief Complaint Chief Complaint  Patient presents with  . Abdominal Pain    HPI Luis Carson is a 42 y.o. male with no significant medical history who presented to the ED for abdominal pain x1 day. Describes cramping RLQ pain that began this morning and has continued all day. Denies fever, chills, N/V, changes in bowel or urinary habits, radiating pain, hematuria, hematochezia/melena. Patient has not experienced this before. Patient has tried nothing prior to coming to the ED. Denies past surgical history.   History reviewed. No pertinent past medical history.  There are no active problems to display for this patient.   History reviewed. No pertinent surgical history.      Home Medications    Prior to Admission medications   Medication Sig Start Date End Date Taking? Authorizing Provider  cephALEXin (KEFLEX) 500 MG capsule Take 1 capsule (500 mg total) by mouth 4 (four) times daily. Patient not taking: Reported on 11/05/2017 02/22/15   Cheri Fowlerose, Kayla, PA-C  ciprofloxacin (CIPRO) 500 MG tablet Take 1 tablet (500 mg total) by mouth 2 (two) times daily for 7 days. 11/05/17 11/12/17  Archibald Marchetta, Jerrel IvoryGabrielle I, PA-C  ibuprofen (ADVIL,MOTRIN) 800 MG tablet Take 1 tablet (800 mg total) by mouth 3 (three) times daily. Patient not taking: Reported on 11/05/2017 02/11/15   Mady GemmaWestfall, Elizabeth C, PA-C  metroNIDAZOLE (FLAGYL) 500 MG tablet Take 1 tablet (500 mg total) by mouth 3 (three) times daily for 7 days. 11/05/17 11/12/17  Keyari Kleeman, Jerrel IvoryGabrielle I, PA-C  oxyCODONE-acetaminophen (PERCOCET) 5-325 MG per tablet Take 1-2 tablets every 4-6 hours as needed for pain Patient not taking: Reported on 08/25/2014 07/16/12   Lowell BoutonBeck, Barbara A, PA-C  oxyCODONE-acetaminophen (PERCOCET) 5-325 MG per tablet Take 1 tablet by mouth every 6 (six) hours as needed. Patient not taking: Reported on 08/25/2014  06/29/13   Palumbo, April, MD  XARELTO 15 MG TABS tablet Take 15 mg by mouth 2 (two) times daily with a meal. 08/11/17   [provider]    Family History No family history on file.  Social History Social History   Tobacco Use  . Smoking status: Current Some Day Smoker  . Smokeless tobacco: Never Used  Substance Use Topics  . Alcohol use: No  . Drug use: No     Allergies   Patient has no known allergies.   Review of Systems Review of Systems  Constitutional: Negative for chills, diaphoresis and fever.  Respiratory: Negative.   Cardiovascular: Negative.   Gastrointestinal: Positive for abdominal pain. Negative for blood in stool, constipation, diarrhea, nausea and vomiting.  Genitourinary: Negative for decreased urine volume, difficulty urinating, dysuria, hematuria, testicular pain and urgency.  Musculoskeletal: Negative.   Skin: Negative.   Neurological: Negative.      Physical Exam Updated Vital Signs BP 117/78 (BP Location: Left Arm)   Pulse 90   Temp 98.6 F (37 C) (Oral)   Resp 18   Ht 6\' 3"  (1.905 m)   Wt 113.4 kg (250 lb)   SpO2 97%   BMI 31.25 kg/m   Physical Exam  Constitutional:  Lying in bed uncomfortably. Grimacing in pain.  Cardiovascular: Normal rate, regular rhythm, normal heart sounds and intact distal pulses.  Pulmonary/Chest: Effort normal and breath sounds normal.  Abdominal: Soft. Normal appearance and bowel sounds are normal. There is tenderness in the right lower quadrant. There is guarding. There is no  rebound, no CVA tenderness, no tenderness at McBurney's point and negative Murphy's sign.  Skin: Skin is warm. He is not diaphoretic. No pallor.  Nursing note and vitals reviewed.    ED Treatments / Results  Labs (all labs ordered are listed, but only abnormal results are displayed) Labs Reviewed  COMPREHENSIVE METABOLIC PANEL - Abnormal; Notable for the following components:      Result Value   Creatinine, Ser 1.30 (*)     All other components within normal limits  URINALYSIS, ROUTINE W REFLEX MICROSCOPIC - Abnormal; Notable for the following components:   Hgb urine dipstick SMALL (*)    All other components within normal limits  LIPASE, BLOOD  CBC    EKG None  Radiology Ct Abdomen Pelvis W Contrast  Result Date: 11/05/2017 CLINICAL DATA:  Right-sided abdominal pain EXAM: CT ABDOMEN AND PELVIS WITH CONTRAST TECHNIQUE: Multidetector CT imaging of the abdomen and pelvis was performed using the standard protocol following bolus administration of intravenous contrast. CONTRAST:  ISOVUE-300 IOPAMIDOL (ISOVUE-300) INJECTION 61% COMPARISON:  Chest CT 09/13/2016 FINDINGS: Lower chest: Lung bases demonstrate no acute consolidation or effusion. The heart size is normal Hepatobiliary: No focal liver abnormality is seen. No gallstones, gallbladder wall thickening, or biliary dilatation. Pancreas: Unremarkable. No pancreatic ductal dilatation or surrounding inflammatory changes. Spleen: Normal in size without focal abnormality. Adrenals/Urinary Tract: Adrenal glands are normal. Subcentimeter hypodense renal lesions too small to further characterize. Bladder normal Stomach/Bowel: Stomach is within normal limits. No dilated small bowel. Negative appendix. Focal wall thickening and adjacent inflammatory change involving the ascending colon. Diverticula are present in the region. No extraluminal gas or abscess. Vascular/Lymphatic: No significant vascular findings are present. No enlarged abdominal or pelvic lymph nodes. Reproductive: Prostate is unremarkable. Other: Negative for free air or free fluid Musculoskeletal: No acute or significant osseous findings. IMPRESSION: 1. Focal wall thickening at the ascending colon with moderate inflammatory changes and presence of diverticula; findings are favored to represent acute right-sided diverticulitis. Consideration also given to appendagitis, given appearance of the pericolonic fat on  coronal views however diverticulitis is favored. No perforation. No abscess. Electronically Signed   By: Jasmine Pang M.D.   On: 11/05/2017 20:41    Procedures Procedures (including critical care time)  Medications Ordered in ED Medications  ondansetron (ZOFRAN) injection 4 mg (4 mg Intravenous Given 11/05/17 1847)  morphine 2 MG/ML injection 2 mg (2 mg Intravenous Given 11/05/17 1847)  iopamidol (ISOVUE-300) 61 % injection 100 mL (100 mLs Intravenous Contrast Given 11/05/17 2013)  sodium chloride 0.9 % bolus 1,000 mL (1,000 mLs Intravenous New Bag/Given 11/05/17 2121)  morphine 2 MG/ML injection 2 mg (2 mg Intravenous Given 11/05/17 2123)  ondansetron (ZOFRAN) injection 4 mg (4 mg Intravenous Given 11/05/17 2123)  ciprofloxacin (CIPRO) tablet 500 mg (500 mg Oral Given 11/05/17 2155)  metroNIDAZOLE (FLAGYL) tablet 500 mg (500 mg Oral Given 11/05/17 2155)     Initial Impression / Assessment and Plan / ED Course  Triage vital signs and the nursing notes have been reviewed.  Pertinent labs & imaging results that were available during care of the patient were reviewed and considered in medical decision making (see chart for details).  Patient presents afebrile and with normal vital signs. His physical exam is significant for exquisite RLQ tenderness even during auscultation with stethoscope. Initial labs are unremarkable and are not clearly indicative of an acutely infectious process. History and physical not consistent with gastroenteritis or nephrolithiasis. IV fluids administered in context of elevated creatinine.  Will order abdominal imaging to further evaluate cause of pain. Highest suspicions for appendicitis, diverticulitis or strangulated hernia.  Clinical Course as of Nov 06 2223  Wed Nov 05, 2017  2105 CT abdomen findings consistent with diverticulitis. Will treat as such. No evidence of perforation or free fluid in the abdomen that warrants additional evaluation. Patient's vitals remain  stable and he is physically stable enough to be treated as an outpatient.   [GM]    Clinical Course User Index [GM] Lamont Glasscock, Sharyon Medicus, PA-C    Final Clinical Impressions(s) / ED Diagnoses  1. Diverticulitis. Cipro 500mg  BID + Flagyl 500mg  TID x7 days prescribed. Education provided on lifestyle and diet modifications to make during treatment. Advised to follow-up with PCP.  Dispo: Home. After thorough clinical evaluation, this patient is determined to be medically stable and can be safely discharged with the previously mentioned treatment and/or outpatient follow-up/referral(s). At this time, there are no other apparent medical conditions that require further screening, evaluation or treatment.   Final diagnoses:  Diverticulitis    ED Discharge Orders        Ordered    ciprofloxacin (CIPRO) 500 MG tablet  2 times daily     11/05/17 2117    metroNIDAZOLE (FLAGYL) 500 MG tablet  3 times daily     11/05/17 2117        Reva Bores 11/05/17 2225    Arby Barrette, MD 11/06/17 [phone]

## 2017-11-05 NOTE — ED Notes (Signed)
Bed: WA17 Expected date:  Expected time:  Means of arrival:  Comments: [place]

## 2017-11-05 NOTE — ED Triage Notes (Signed)
Patient here from home with complaints of right side abdominal pain that started last night, increasingly worse. Denies n/v/d.

## 2018-04-06 ENCOUNTER — Emergency Department (HOSPITAL_BASED_OUTPATIENT_CLINIC_OR_DEPARTMENT_OTHER)
Admit: 2018-04-06 | Discharge: 2018-04-06 | Disposition: A | Discharge status: Home / Self Care | Payer: Self-pay | Attending: Emergency Medicine | Admitting: Emergency Medicine

## 2018-04-06 ENCOUNTER — Emergency Department (HOSPITAL_COMMUNITY)
Admission: EM | Admit: 2018-04-06 | Discharge: 2018-04-06 | Disposition: A | Discharge status: Home / Self Care | Payer: Self-pay | Attending: Emergency Medicine | Admitting: Emergency Medicine

## 2018-04-06 ENCOUNTER — Encounter (HOSPITAL_COMMUNITY): Payer: Self-pay | Admitting: *Deleted

## 2018-04-06 ENCOUNTER — Emergency Department (HOSPITAL_COMMUNITY): Payer: Self-pay

## 2018-04-06 DIAGNOSIS — M79661 Pain in right lower leg: Secondary | ICD-10-CM

## 2018-04-06 DIAGNOSIS — F17200 Nicotine dependence, unspecified, uncomplicated: Secondary | ICD-10-CM | POA: Insufficient documentation

## 2018-04-06 DIAGNOSIS — R21 Rash and other nonspecific skin eruption: Secondary | ICD-10-CM | POA: Insufficient documentation

## 2018-04-06 DIAGNOSIS — M79604 Pain in right leg: Secondary | ICD-10-CM | POA: Insufficient documentation

## 2018-04-06 HISTORY — DX: Acute embolism and thrombosis of unspecified deep veins of unspecified lower extremity: I82.409

## 2018-04-06 NOTE — Progress Notes (Signed)
Right lower extremity venous duplex completed. Preliminary results - There is no evidence of a DVT or Baker's cyst [employer] Kellin Carson, RVS 12/30/8:55 AM

## 2018-04-06 NOTE — ED Triage Notes (Addendum)
Pt complains of right lower leg pain x 2 days. Pt denies injury. 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 Pt states he is prescribed xarelto but has not been able to afford it.

## 2018-04-06 NOTE — ED Provider Notes (Signed)
[facility]-EMERGENCY DEPT Provider Note   CSN: [id number] Arrival date & time: 04/06/18  0703     History   Chief Complaint Chief Complaint  Patient presents with  . Leg Pain    right    HPI Luis Carson is a 42 y.o. male with a history of a DVT who presents to the emergency department with a chief complaint of right lower leg pain.  He denies recent trauma or injury to the area.  He states that the pain feels throbbing and very similar to when he was diagnosed with a previous DVT.  He reports that he was on Xarelto for about a year and a half, but then was taken off of it by his primary care provider.  He denies shortness of breath, chest pain, fever, chills, right ankle or knee pain, numbness, or weakness.  Pain in the right lower extremity is nonradiating and worse with standing for long periods of time.  No known alleviating factors.  The history is provided by the patient. No language interpreter was used.    Past Medical History:  Diagnosis Date  . DVT, lower extremity (HCC)     There are no active problems to display for this patient.   History reviewed. No pertinent surgical history.      Home Medications    Prior to Admission medications   Not on File    Family History No family history on file.  Social History Social History   Tobacco Use  . Smoking status: Current Some Day Smoker  . Smokeless tobacco: Never Used  Substance Use Topics  . Alcohol use: No  . Drug use: No     Allergies   Patient has no known allergies.   Review of Systems Review of Systems  Constitutional: Negative for appetite change and fever.  Respiratory: Negative for shortness of breath.   Cardiovascular: Negative for chest pain.  Gastrointestinal: Negative for abdominal pain.  Genitourinary: Negative for dysuria.  Musculoskeletal: Positive for arthralgias, gait problem and myalgias. Negative for back pain, neck pain and neck stiffness.  Skin:  Negative for rash.  Allergic/Immunologic: Negative for immunocompromised state.  Neurological: Negative for weakness, numbness and headaches.  Psychiatric/Behavioral: Negative for confusion.     Physical Exam Updated Vital Signs BP 114/71   Pulse 63   Temp 98.2 F (36.8 C) (Oral)   Resp 18   SpO2 96%   Physical Exam Vitals signs and nursing note reviewed.  Constitutional:      Appearance: He is well-developed.  HENT:     Head: Normocephalic.  Eyes:     Conjunctiva/sclera: Conjunctivae normal.  Neck:     Musculoskeletal: Neck supple.  Cardiovascular:     Rate and Rhythm: Normal rate and regular rhythm.     Heart sounds: No murmur.  Pulmonary:     Effort: Pulmonary effort is normal. No respiratory distress.     Breath sounds: No stridor. No wheezing, rhonchi or rales.  Chest:     Chest wall: No tenderness.  Abdominal:     General: There is no distension.     Palpations: Abdomen is soft.  Musculoskeletal:        General: Tenderness present. No swelling, deformity or signs of injury.     Right lower leg: No edema.     Left lower leg: No edema.     Comments: There is a brown-colored plaque noted to the right lower leg, just medial to the mid-shaft of the tibia.  He is focally tender to palpation over this lesion.  Normal exam of the right ankle and knee.  He is neurovascularly intact.  Negative Homans sign.  There is otherwise no reproducible tenderness of the calf or thigh.  Left lower extremity is unremarkable.  Skin:    General: Skin is warm and dry.  Neurological:     Mental Status: He is alert.  Psychiatric:        Behavior: Behavior normal.          ED Treatments / Results  Labs (all labs ordered are listed, but only abnormal results are displayed) Labs Reviewed - No data to display  EKG None  Radiology Dg Tibia/fibula Right  Result Date: 04/06/2018 CLINICAL DATA:  Right lower leg pain for 2 days, atraumatic EXAM: RIGHT TIBIA AND FIBULA - 2 VIEW  COMPARISON:  None. FINDINGS: No fracture or erosion. Spurring at the medial malleolus attributed to old injury. IMPRESSION: No acute finding. Electronically Signed   By: Marnee Spring M.D.   On: 04/06/2018 10:22   Vas [place] Lower Extremity Venous (dvt) (only [facility])  Result Date: 04/06/2018  Lower Venous Study Indications: Pain, and Swelling. Right "knot" on the middle region of the shin  Risk Factors: DVT History of. Comparison Study: Comparison study available in care everywhere. Performing Technologist: Toma Deiters RVS  Examination Guidelines: A complete evaluation includes B-mode imaging, spectral Doppler, color Doppler, and power Doppler as needed of all accessible portions of each vessel. Bilateral testing is considered an integral part of a complete examination. Limited examinations for reoccurring indications may be performed as noted.  Right Venous Findings: +---------+---------------+---------+-----------+----------+------------------+          CompressibilityPhasicitySpontaneityPropertiesSummary            +---------+---------------+---------+-----------+----------+------------------+ CFV      Full           Yes      Yes                                     +---------+---------------+---------+-----------+----------+------------------+ SFJ      Full                                                            +---------+---------------+---------+-----------+----------+------------------+ FV Prox  Full           Yes      Yes                                     +---------+---------------+---------+-----------+----------+------------------+ FV Mid   Full                                                            +---------+---------------+---------+-----------+----------+------------------+ FV DistalFull           Yes      Yes                                     +---------+---------------+---------+-----------+----------+------------------+  PFV      Full            Yes      Yes                                     +---------+---------------+---------+-----------+----------+------------------+ POP      Full           Yes      Yes                                     +---------+---------------+---------+-----------+----------+------------------+ PTV      Full                                                            +---------+---------------+---------+-----------+----------+------------------+ PERO     Full                                         Difficult to image +---------+---------------+---------+-----------+----------+------------------+  Left Venous Findings: +---+---------------+---------+-----------+----------+-------+    CompressibilityPhasicitySpontaneityPropertiesSummary +---+---------------+---------+-----------+----------+-------+ CFVFull           Yes      Yes                          +---+---------------+---------+-----------+----------+-------+ SFJFull                                                 +---+---------------+---------+-----------+----------+-------+    Summary: Right: There is no evidence of deep vein thrombosis in the lower extremity. No cystic structure found in the popliteal fossa. Left: There is no evidence of a common femoral vein obstruction  *See table(s) above for measurements and observations. Electronically signed by Waverly Ferrari MD on 04/06/2018 at 5:14:39 PM.    Final     Procedures Procedures (including critical care time)  Medications Ordered in ED Medications - No data to display   Initial Impression / Assessment and Plan / ED Course  I have reviewed the triage vital signs and the nursing notes.  Pertinent labs & imaging results that were available during my care of the patient were reviewed by me and considered in my medical decision making (see chart for details).     42 year old male with a history of DVT presenting for right lower extremity pain for the  last 2 days.  He is concerned that he may have another DVT.  He is having no shortness of breath or chest pain or other symptoms to suggest pulmonary embolism.  On exam, there is a brown-colored plaque noted to the right mid lower leg.  He is focally tender over this lesion.  DVT study is negative.  X-ray of the right tibia and fibula is unremarkable.  Lesion does not appear consistent with erythema nodosum.  It does not appear infectious.  The patient has not required pain control in the ED.  I have recommended he follow-up with dermatology  as this area may warrant biopsy.  He was also given strict return precautions to the emergency department.  He is hemodynamically stable and in no acute distress.  He is safe for discharge home with outpatient follow-up at this time.  Final Clinical Impressions(s) / ED Diagnoses   Final diagnoses:  Pain in right lower leg  Rash and nonspecific skin eruption    ED Discharge Orders    None       Barkley BoardsMcDonald, Tullio Chausse A, PA-C 04/06/18 1742    Mesner, Barbara CowerJason, MD 04/07/18 1439

## 2018-04-06 NOTE — Discharge Instructions (Signed)
Thank you for allowing me to care for you today in the Emergency Department.   You can take 600 mg of ibuprofen with food or 650 mg of Tylenol once every 6 hours for pain control.  You can alternate between these 2 medications every 3 hours.  You can try applying an ice pack for 15 to 20 minutes to improve your pain or elevating her right leg so your toes are at or above the level of your nose.  Follow-up with dermatology.  I have listed several clinics above.  If you are unable to get in with dermatology, I would recommend following up with your primary care provider.  Return to the emergency department if you develop a high fever, if you develop significant redness or swelling around the area, if you begin to have thick, mucus-like drainage from the wound, or other new, concerning symptoms.

## 2018-06-30 ENCOUNTER — Emergency Department (HOSPITAL_COMMUNITY)
Admission: EM | Admit: 2018-06-30 | Discharge: 2018-06-30 | Disposition: A | Discharge status: Home / Self Care | Payer: Self-pay | Attending: Emergency Medicine | Admitting: Emergency Medicine

## 2018-06-30 ENCOUNTER — Emergency Department (HOSPITAL_COMMUNITY): Payer: Self-pay

## 2018-06-30 ENCOUNTER — Encounter (HOSPITAL_COMMUNITY): Payer: Self-pay | Admitting: Emergency Medicine

## 2018-06-30 ENCOUNTER — Other Ambulatory Visit: Payer: Self-pay

## 2018-06-30 DIAGNOSIS — M25511 Pain in right shoulder: Secondary | ICD-10-CM | POA: Insufficient documentation

## 2018-06-30 DIAGNOSIS — Y92017 Garden or yard in single-family (private) house as the place of occurrence of the external cause: Secondary | ICD-10-CM | POA: Insufficient documentation

## 2018-06-30 DIAGNOSIS — W19XXXA Unspecified fall, initial encounter: Secondary | ICD-10-CM | POA: Insufficient documentation

## 2018-06-30 DIAGNOSIS — Y939 Activity, unspecified: Secondary | ICD-10-CM | POA: Insufficient documentation

## 2018-06-30 DIAGNOSIS — Y998 Other external cause status: Secondary | ICD-10-CM | POA: Insufficient documentation

## 2018-06-30 DIAGNOSIS — F17200 Nicotine dependence, unspecified, uncomplicated: Secondary | ICD-10-CM | POA: Insufficient documentation

## 2018-06-30 NOTE — ED Notes (Signed)
Patient transported to X-ray 

## 2018-06-30 NOTE — Discharge Instructions (Signed)
Sling for comfort.  Take ibuprofen and tylenol for pain  Please follow up with an orthopedist in 1-2 weeks if symptoms persist

## 2018-06-30 NOTE — ED Provider Notes (Signed)
[facility]-EMERGENCY DEPT Provider Note   CSN: [id number] Arrival date & time: 06/30/18  1008    History   Chief Complaint Chief Complaint  Patient presents with  . Fall  . Shoulder Injury    HPI Luis Carson is a 43 y.o. male.     HPI 43 year old male presents the emergency department complaints of right shoulder pain after falling 2 days ago in the yard.  He is concerned about the possibility of a right shoulder dislocation.  He states pain with active range of motion of his right shoulder but less pain with passive range of motion of right shoulder.  No weakness in the right hand.  No other injury.  Feels the majority the pain in the anterior right shoulder.  No clavicle tenderness.  Symptoms are mild to moderate in severity.    Past Medical History:  Diagnosis Date  . DVT, lower extremity (HCC)     There are no active problems to display for this patient.   History reviewed. No pertinent surgical history.      Home Medications    Prior to Admission medications   Not on File    Family History No family history on file.  Social History Social History   Tobacco Use  . Smoking status: Current Some Day Smoker  . Smokeless tobacco: Never Used  Substance Use Topics  . Alcohol use: No  . Drug use: No     Allergies   Patient has no known allergies.   Review of Systems Review of Systems  All other systems reviewed and are negative.    Physical Exam Updated Vital Signs BP (!) 137/95 Comment: Simultaneous filing. User may not have seen previous data.  Pulse 95   Temp 98.2 F (36.8 C) (Oral)   Resp 16   SpO2 96%   Physical Exam Vitals signs and nursing note reviewed.  Constitutional:      Appearance: He is well-developed.  HENT:     Head: Normocephalic.  Neck:     Musculoskeletal: Normal range of motion.  Pulmonary:     Effort: Pulmonary effort is normal.  Abdominal:     General: There is no distension.   Musculoskeletal: Normal range of motion.     Comments: Normal grip strength right hand.  Normal right radial pulse.  Full range of motion of right wrist and right elbow.  Mild painful range of motion with active range of motion of the right shoulder.  Able to passively range right shoulder.  No obvious dislocation  Neurological:     Mental Status: He is alert and oriented to person, place, and time.      ED Treatments / Results  Labs (all labs ordered are listed, but only abnormal results are displayed) Labs Reviewed - No data to display  EKG None  Radiology Dg Shoulder Right  Result Date: 06/30/2018 CLINICAL DATA:  Pain following fall EXAM: RIGHT SHOULDER - 2+ VIEW COMPARISON:  None. FINDINGS: Frontal, oblique, and Y scapular images were obtained. There is no fracture or dislocation. There is slight bony overgrowth along the lateral right clavicle. There is no appreciable joint space narrowing or erosion. No intra-articular calcification. Visualized right lung is clear. IMPRESSION: Slight bony overgrowth along the lateral right clavicle. No appreciable joint space narrowing or erosion. No fracture or dislocation. Electronically Signed   By: Bretta Bang III M.D.   On: 06/30/2018 11:06    Procedures .Splint Application Performed by: Azalia Bilis, MD Authorized by:  Azalia Bilis, MD     SPLINT APPLICATION Authorized by: Azalia Bilis Consent: Verbal consent obtained. Risks and benefits: risks, benefits and alternatives were discussed Consent given by: patient Splint applied by: nurse Location details: Right upper extremity Splint type: Shoulder immobilizer Supplies used: Shoulder immobilizer Post-procedure: The splinted body part was neurovascularly unchanged following the procedure. Patient tolerance: Patient tolerated the procedure well with no immediate complications.     Medications Ordered in ED Medications - No data to display   Initial Impression /  Assessment and Plan / ED Course  I have reviewed the triage vital signs and the nursing notes.  Pertinent labs & imaging results that were available during my care of the patient were reviewed by me and considered in my medical decision making (see chart for details).        X-ray personally reviewed and demonstrates no acute osseous abnormality.  Clavicle is normal.  Patient placed in shoulder immobilizer for comfort.  Outpatient orthopedic follow-up.  Primary care follow-up.  Patient understands return the emergency department for new or worsening symptoms  Final Clinical Impressions(s) / ED Diagnoses   Final diagnoses:  Acute pain of right shoulder    ED Discharge Orders    None       Azalia Bilis, MD 06/30/18 1525

## 2018-06-30 NOTE — ED Notes (Signed)
Discharge instructions reviewed with pt, no concerns or questions noted.  Pt ambulatory at discharge.

## 2018-06-30 NOTE — ED Triage Notes (Signed)
Pt reports that he fell two nights ago in mud in the yard. Thinks his right shoulder is dislocated.

## 2020-07-30 IMAGING — CR RIGHT SHOULDER - 2+ VIEW
3 series · 3 of 3 positions shown · non-contrast
Comparison: None.

CLINICAL DATA: Pain following fall

EXAM:
RIGHT SHOULDER - 2+ VIEW

[w shoulder external right (1 of 2)]
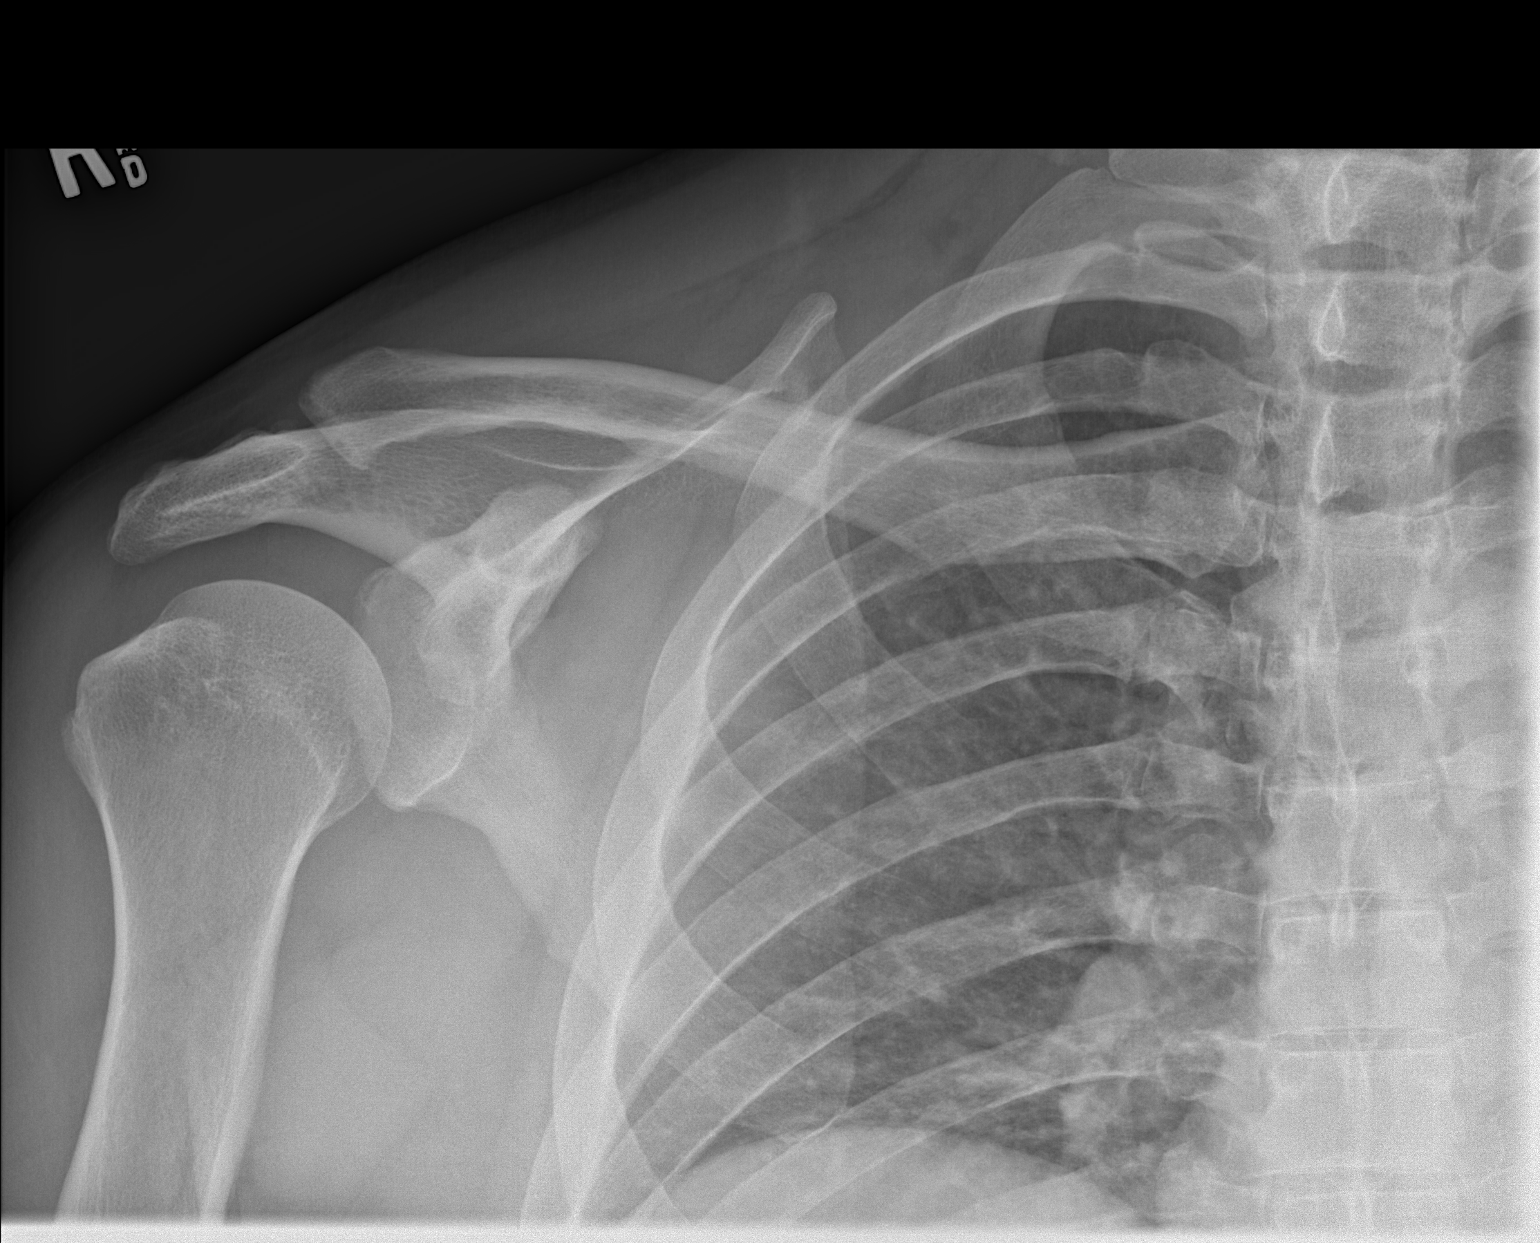

[w shoulder external right (2 of 2)]
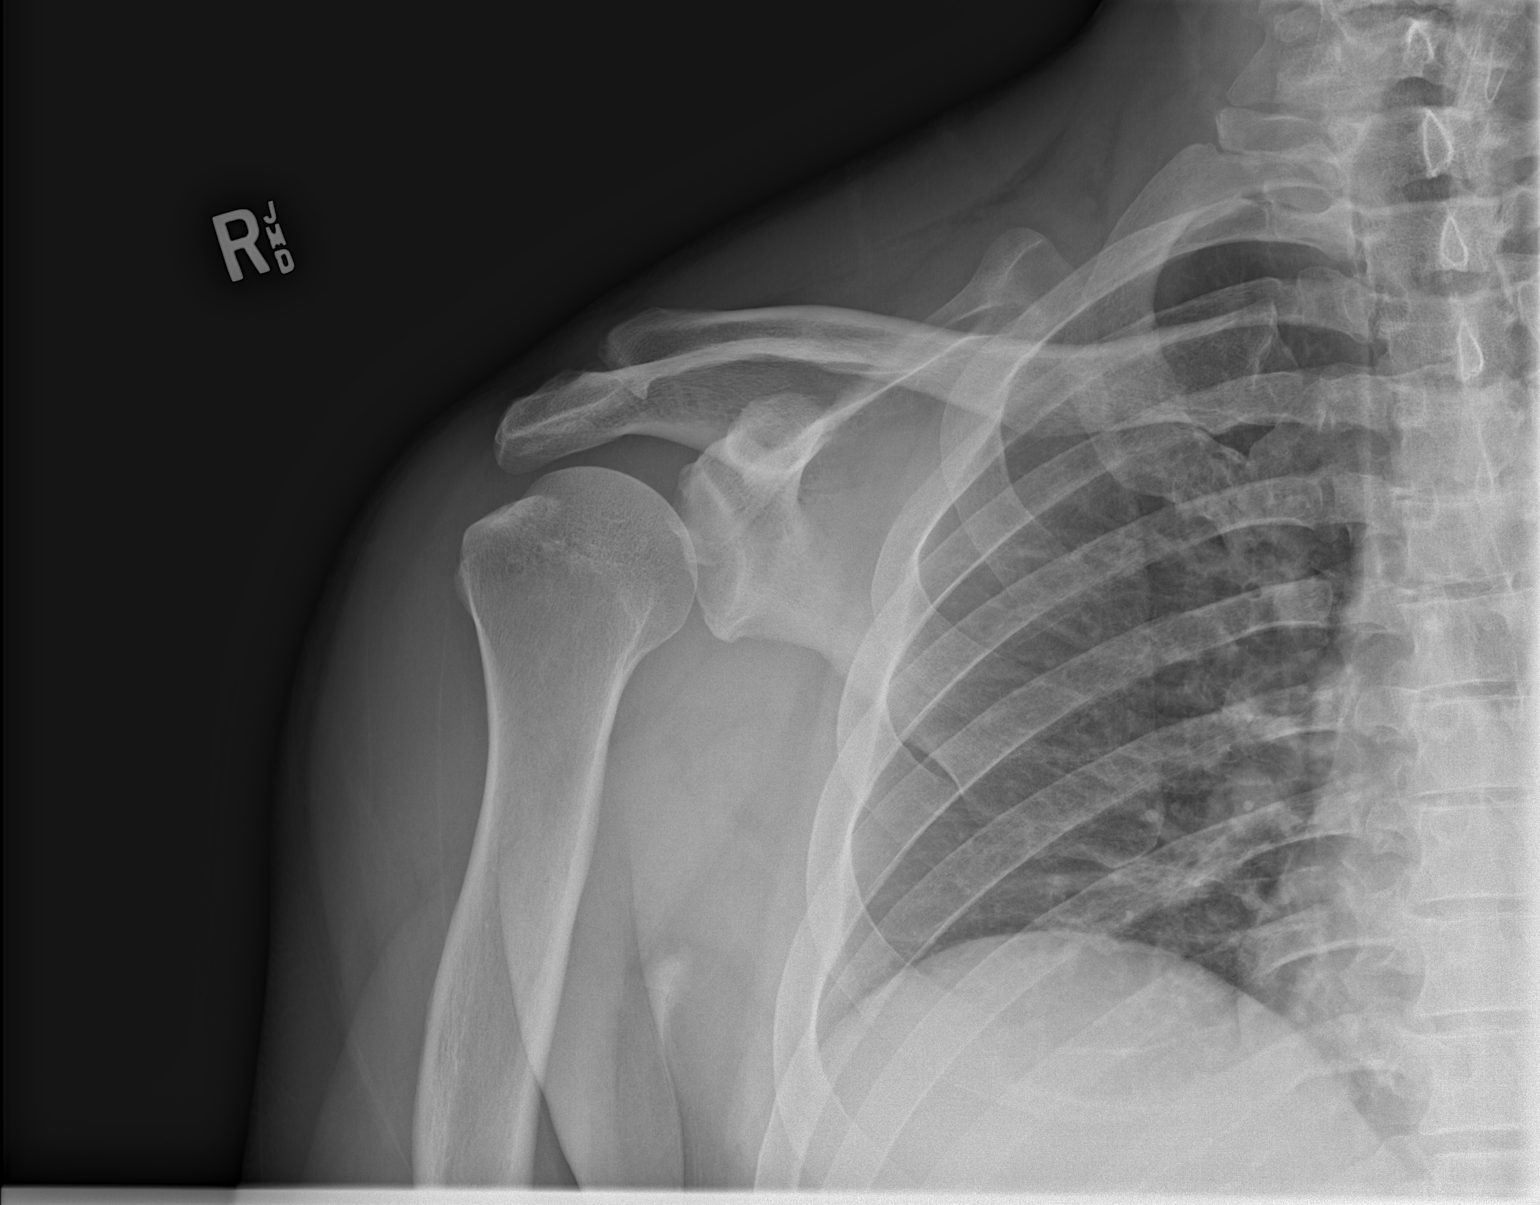

[w shoulder y-view right]
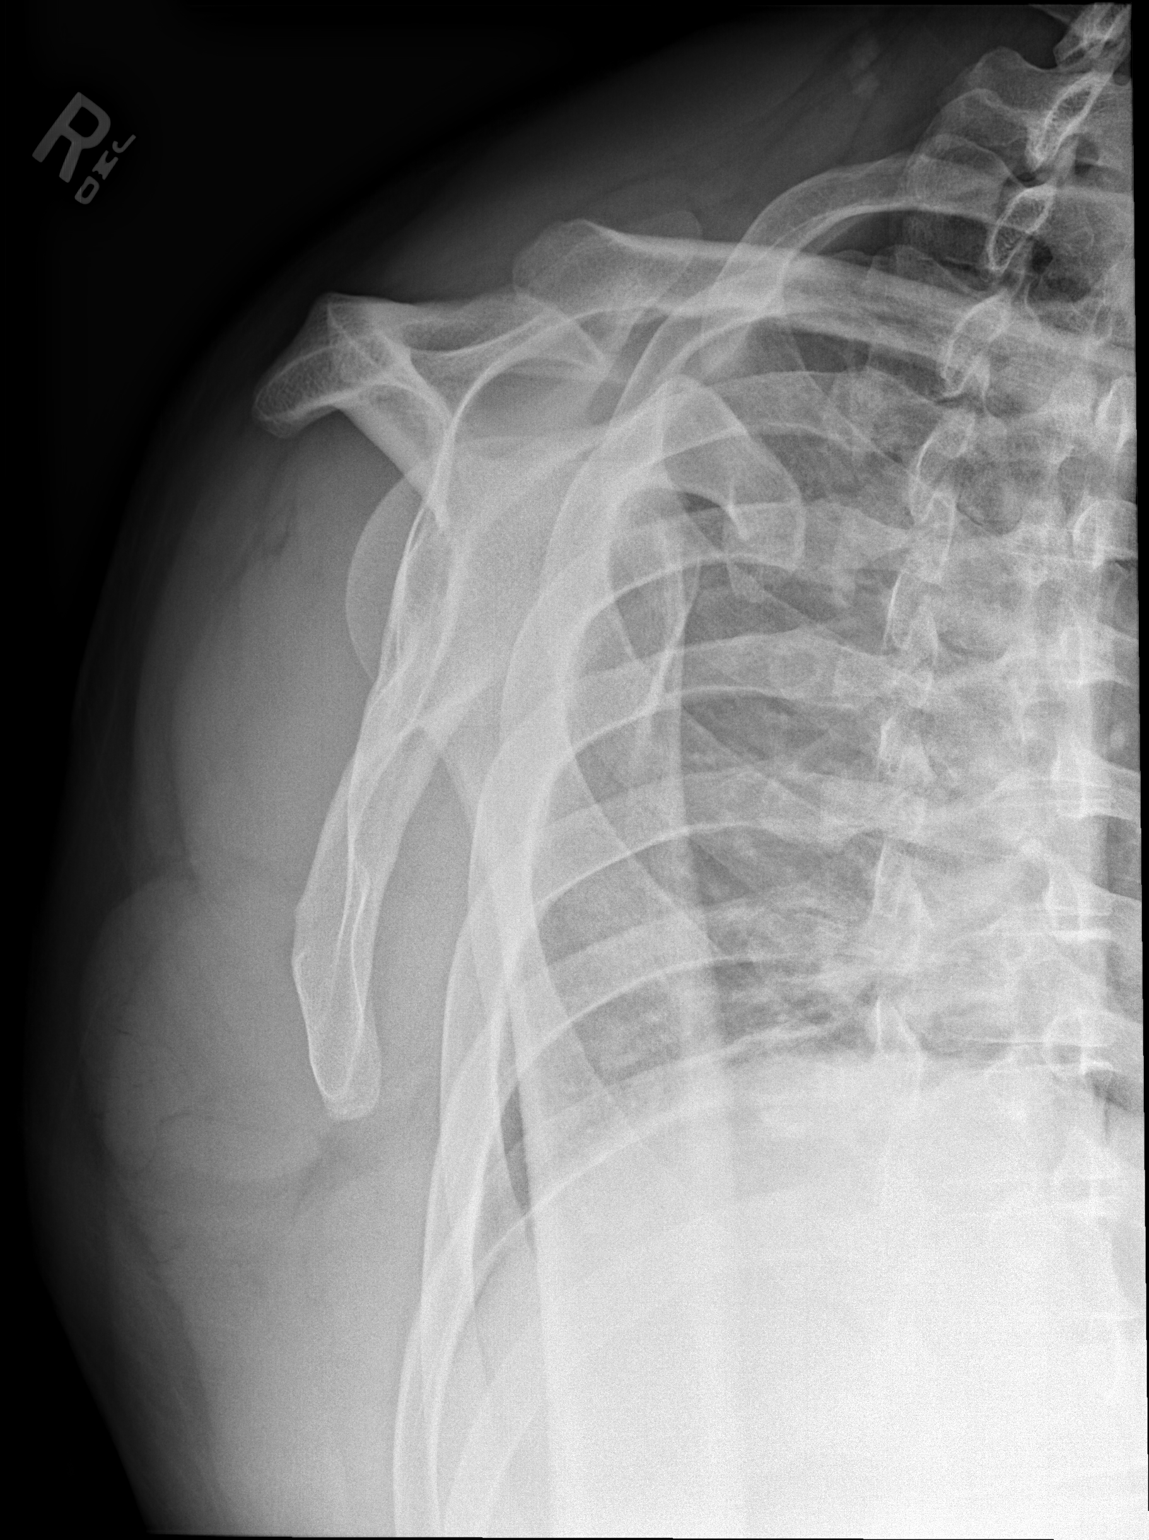

[3 of 3 positions shown; findings below may reference images not displayed]

FINDINGS: Frontal, oblique, and Y scapular images were obtained. There is no
fracture or dislocation. There is slight bony overgrowth along the
lateral right clavicle. There is no appreciable joint space
narrowing or erosion. No intra-articular calcification. Visualized
right lung is clear.
IMPRESSION: Slight bony overgrowth along the lateral right clavicle. No
appreciable joint space narrowing or erosion. No fracture or
dislocation.
